# Patient Record
Sex: Female | Born: 1955 | Race: White | Hispanic: No | Smoking: Current every day smoker
Health system: Southern US, Community
[De-identification: ages and names within clinical notes are randomized; demographics above are authoritative.]

## PROBLEM LIST (undated history)

## (undated) DIAGNOSIS — I10 Essential (primary) hypertension: Secondary | ICD-10-CM

## (undated) DIAGNOSIS — G8929 Other chronic pain: Secondary | ICD-10-CM

## (undated) DIAGNOSIS — R112 Nausea with vomiting, unspecified: Secondary | ICD-10-CM

## (undated) DIAGNOSIS — M797 Fibromyalgia: Secondary | ICD-10-CM

## (undated) DIAGNOSIS — S32810B Multiple fractures of pelvis with stable disruption of pelvic ring, initial encounter for open fracture: Secondary | ICD-10-CM

## (undated) DIAGNOSIS — I499 Cardiac arrhythmia, unspecified: Secondary | ICD-10-CM

## (undated) DIAGNOSIS — M1990 Unspecified osteoarthritis, unspecified site: Secondary | ICD-10-CM

## (undated) DIAGNOSIS — S42102A Fracture of unspecified part of scapula, left shoulder, initial encounter for closed fracture: Secondary | ICD-10-CM

## (undated) DIAGNOSIS — Z9889 Other specified postprocedural states: Secondary | ICD-10-CM

## (undated) DIAGNOSIS — M549 Dorsalgia, unspecified: Secondary | ICD-10-CM

## (undated) DIAGNOSIS — Z8711 Personal history of peptic ulcer disease: Secondary | ICD-10-CM

## (undated) DIAGNOSIS — T884XXA Failed or difficult intubation, initial encounter: Secondary | ICD-10-CM

## (undated) DIAGNOSIS — K219 Gastro-esophageal reflux disease without esophagitis: Secondary | ICD-10-CM

## (undated) DIAGNOSIS — Z8719 Personal history of other diseases of the digestive system: Secondary | ICD-10-CM

## (undated) HISTORY — PX: CARPAL TUNNEL RELEASE: SHX101

## (undated) HISTORY — PX: BACK SURGERY: SHX140

## (undated) HISTORY — PX: PLANTAR'S WART EXCISION: SHX2240

## (undated) HISTORY — PX: INCONTINENCE SURGERY: SHX676

## (undated) HISTORY — PX: COLONOSCOPY: SHX174

## (undated) HISTORY — PX: KNEE ARTHROSCOPY: SHX127

---
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
## 1982-08-30 HISTORY — PX: ABDOMINAL HYSTERECTOMY: SHX81

## 2003-08-31 HISTORY — PX: LAPAROSCOPIC CHOLECYSTECTOMY: SUR755

## 2005-04-08 ENCOUNTER — Encounter: Admission: RE | Admit: 2005-04-08 | Discharge: 2005-04-08 | Payer: Self-pay | Admitting: Family Medicine

## 2005-04-19 ENCOUNTER — Encounter: Admission: RE | Admit: 2005-04-19 | Discharge: 2005-04-19 | Payer: Self-pay | Admitting: Family Medicine

## 2010-09-20 ENCOUNTER — Encounter: Payer: Self-pay | Admitting: Family Medicine

## 2011-09-16 ENCOUNTER — Encounter (HOSPITAL_COMMUNITY): Payer: Self-pay | Admitting: Pharmacy Technician

## 2011-09-21 ENCOUNTER — Encounter (HOSPITAL_COMMUNITY)
Admission: RE | Admit: 2011-09-21 | Discharge: 2011-09-21 | Disposition: A | Discharge status: Home / Self Care | Payer: Managed Care, Other (non HMO) | Source: Ambulatory Visit | Attending: Neurosurgery | Admitting: Neurosurgery

## 2011-09-21 ENCOUNTER — Encounter (HOSPITAL_COMMUNITY): Payer: Self-pay

## 2011-09-21 ENCOUNTER — Other Ambulatory Visit: Payer: Self-pay

## 2011-09-21 ENCOUNTER — Encounter (HOSPITAL_COMMUNITY)
Admission: RE | Admit: 2011-09-21 | Discharge: 2011-09-21 | Disposition: A | Discharge status: Home / Self Care | Payer: Managed Care, Other (non HMO) | Source: Ambulatory Visit | Attending: Anesthesiology | Admitting: Anesthesiology

## 2011-09-21 HISTORY — DX: Other specified postprocedural states: Z98.890

## 2011-09-21 HISTORY — DX: Other specified postprocedural states: R11.2

## 2011-09-21 HISTORY — DX: Unspecified osteoarthritis, unspecified site: M19.90

## 2011-09-21 HISTORY — DX: Fibromyalgia: M79.7

## 2011-09-21 HISTORY — DX: Essential (primary) hypertension: I10

## 2011-09-21 HISTORY — DX: Gastro-esophageal reflux disease without esophagitis: K21.9

## 2011-09-21 LAB — BASIC METABOLIC PANEL
CO2: 23 mEq/L (ref 19–32)
Calcium: 9.6 mg/dL (ref 8.4–10.5)
Chloride: 103 mEq/L (ref 96–112)
Creatinine, Ser: 0.65 mg/dL (ref 0.50–1.10)
Glucose, Bld: 101 mg/dL — ABNORMAL HIGH (ref 70–99)
Sodium: 138 mEq/L (ref 135–145)

## 2011-09-21 LAB — CBC
HCT: 41.7 % (ref 36.0–46.0)
MCH: 30.1 pg (ref 26.0–34.0)
MCV: 87.8 fL (ref 78.0–100.0)
RBC: 4.75 MIL/uL (ref 3.87–5.11)

## 2011-09-21 LAB — SURGICAL PCR SCREEN: MRSA, PCR: NEGATIVE

## 2011-09-21 LAB — TYPE AND SCREEN: ABO/RH(D): O POS

## 2011-09-21 NOTE — Pre-Procedure Instructions (Addendum)
20 SAHASRA BELUE  09/21/2011   Your procedure is scheduled on:  1.30.13  Report to [facility] aT 630 AM.  Call this number if you have problems the morning of surgery: [phone]   Remember:   Do not eat food:After Midnight.  May have clear liquids: up to 4 Hours before arrival.  Clear liquids include soda, tea, black coffee, apple or grape juice, broth.  Take these medicines the morning of surgery with A SIP OF WATER :cymbalta, nexium, robaxin  STOP asa, herbal meds, blood thinners, nsaids   Do not wear jewelry, make-up or nail polish.  Do not wear lotions, powders, or perfumes. You may wear deodorant.  Do not shave 48 hours prior to surgery.  Do not bring valuables to the hospital.  Contacts, dentures or bridgework may not be worn into surgery.  Leave suitcase in the car. After surgery it may be brought to your room.  For patients admitted to the hospital, checkout time is 11:00 AM the day of discharge.   Patients discharged the day of surgery will not be allowed to drive home.  Name and phone number of your driver: Fayrene Fearing [phone]  Special Instructions: CHG Shower Use Special Wash: 1/2 bottle night before surgery and 1/2 bottle morning of surgery.   Please read over the following fact sheets that you were given: Pain Booklet, Coughing and Deep Breathing, MRSA Information and Surgical Site Infection Prevention

## 2011-09-29 ENCOUNTER — Encounter (HOSPITAL_COMMUNITY)
Admission: RE | Disposition: A | Discharge status: Home / Self Care | Payer: Self-pay | Source: Ambulatory Visit | Attending: Neurosurgery

## 2011-09-29 ENCOUNTER — Ambulatory Visit (HOSPITAL_COMMUNITY): Payer: Managed Care, Other (non HMO)

## 2011-09-29 ENCOUNTER — Ambulatory Visit (HOSPITAL_COMMUNITY): Payer: Managed Care, Other (non HMO) | Admitting: Certified Registered"

## 2011-09-29 ENCOUNTER — Encounter (HOSPITAL_COMMUNITY): Payer: Self-pay | Admitting: Certified Registered"

## 2011-09-29 ENCOUNTER — Inpatient Hospital Stay (HOSPITAL_COMMUNITY)
Admission: RE | Admit: 2011-09-29 | Discharge: 2011-10-03 | DRG: 457 | Disposition: A | Discharge status: Home / Self Care | Payer: Managed Care, Other (non HMO) | Source: Ambulatory Visit | Attending: Neurosurgery | Admitting: Neurosurgery

## 2011-09-29 ENCOUNTER — Encounter (HOSPITAL_COMMUNITY): Payer: Self-pay | Admitting: *Deleted

## 2011-09-29 DIAGNOSIS — Z01812 Encounter for preprocedural laboratory examination: Secondary | ICD-10-CM

## 2011-09-29 DIAGNOSIS — K219 Gastro-esophageal reflux disease without esophagitis: Secondary | ICD-10-CM | POA: Diagnosis present

## 2011-09-29 DIAGNOSIS — M4310 Spondylolisthesis, site unspecified: Secondary | ICD-10-CM | POA: Diagnosis present

## 2011-09-29 DIAGNOSIS — M4120 Other idiopathic scoliosis, site unspecified: Principal | ICD-10-CM | POA: Diagnosis present

## 2011-09-29 DIAGNOSIS — B370 Candidal stomatitis: Secondary | ICD-10-CM | POA: Diagnosis present

## 2011-09-29 DIAGNOSIS — IMO0001 Reserved for inherently not codable concepts without codable children: Secondary | ICD-10-CM | POA: Diagnosis present

## 2011-09-29 DIAGNOSIS — I10 Essential (primary) hypertension: Secondary | ICD-10-CM | POA: Diagnosis present

## 2011-09-29 DIAGNOSIS — M47816 Spondylosis without myelopathy or radiculopathy, lumbar region: Secondary | ICD-10-CM

## 2011-09-29 HISTORY — PX: ANTERIOR LAT LUMBAR FUSION: SHX1168

## 2011-09-29 SURGERY — ANTERIOR LATERAL LUMBAR FUSION 2 LEVELS
Anesthesia: General | Site: Back | Wound class: Clean

## 2011-09-29 MED ORDER — ACETAMINOPHEN 325 MG PO TABS: 650.0000 mg | ORAL_TABLET | ORAL | Status: DC | PRN

## 2011-09-29 MED ORDER — ONDANSETRON HCL 4 MG/2ML IJ SOLN
INTRAMUSCULAR | Status: DC | PRN
  Administered 2011-09-29: 4 mg via INTRAVENOUS

## 2011-09-29 MED ORDER — GLYCOPYRROLATE 0.2 MG/ML IJ SOLN
INTRAMUSCULAR | Status: DC | PRN
  Administered 2011-09-29: .7 mg via INTRAVENOUS

## 2011-09-29 MED ORDER — HYDROMORPHONE HCL PF 1 MG/ML IJ SOLN
0.5000 mg | INTRAMUSCULAR | Status: DC | PRN
  Administered 2011-09-29 – 2011-10-02 (×18): 1 mg via INTRAVENOUS
  Administered 2011-10-03: 0.5 mg via INTRAVENOUS
  Filled 2011-09-29 (×18): qty 1

## 2011-09-29 MED ORDER — KCL IN DEXTROSE-NACL 20-5-0.45 MEQ/L-%-% IV SOLN
INTRAVENOUS | Status: AC
  Filled 2011-09-29: qty 1000

## 2011-09-29 MED ORDER — LIDOCAINE-EPINEPHRINE 1 %-1:100000 IJ SOLN
INTRAMUSCULAR | Status: DC | PRN
  Administered 2011-09-29: 20 mL

## 2011-09-29 MED ORDER — PHENYLEPHRINE HCL 10 MG/ML IJ SOLN
20.0000 mg | INTRAVENOUS | Status: DC | PRN
  Administered 2011-09-29: 50 ug/min via INTRAVENOUS

## 2011-09-29 MED ORDER — THROMBIN 20000 UNITS EX KIT
PACK | CUTANEOUS | Status: DC | PRN
  Administered 2011-09-29: 15:00:00 via TOPICAL

## 2011-09-29 MED ORDER — MIDAZOLAM HCL 5 MG/5ML IJ SOLN
INTRAMUSCULAR | Status: DC | PRN
  Administered 2011-09-29: 2 mg via INTRAVENOUS

## 2011-09-29 MED ORDER — ACETAMINOPHEN 10 MG/ML IV SOLN
INTRAVENOUS | Status: AC
  Administered 2011-09-29 (×2): 1000 mg via INTRAVENOUS
  Filled 2011-09-29: qty 100

## 2011-09-29 MED ORDER — PHENYLEPHRINE HCL 10 MG/ML IJ SOLN
INTRAMUSCULAR | Status: DC | PRN
  Administered 2011-09-29 (×4): 80 ug via INTRAVENOUS

## 2011-09-29 MED ORDER — MIDAZOLAM HCL 2 MG/2ML IJ SOLN
1.0000 mg | INTRAMUSCULAR | Status: DC | PRN
  Administered 2011-09-29: 1 mg via INTRAVENOUS

## 2011-09-29 MED ORDER — ZOLPIDEM TARTRATE 10 MG PO TABS
10.0000 mg | ORAL_TABLET | Freq: Every day | ORAL | Status: DC
  Administered 2011-10-01 – 2011-10-02 (×2): 10 mg via ORAL
  Filled 2011-09-29 (×2): qty 1

## 2011-09-29 MED ORDER — SUCCINYLCHOLINE CHLORIDE 20 MG/ML IJ SOLN
INTRAMUSCULAR | Status: DC | PRN
  Administered 2011-09-29: 100 mg via INTRAVENOUS

## 2011-09-29 MED ORDER — 0.9 % SODIUM CHLORIDE (POUR BTL) OPTIME
TOPICAL | Status: DC | PRN
  Administered 2011-09-29 (×2): 1000 mL

## 2011-09-29 MED ORDER — SODIUM CHLORIDE 0.9 % IV SOLN
INTRAVENOUS | Status: AC
  Filled 2011-09-29: qty 500

## 2011-09-29 MED ORDER — HYDROMORPHONE HCL PF 1 MG/ML IJ SOLN
0.2500 mg | INTRAMUSCULAR | Status: DC | PRN
  Administered 2011-09-29 (×5): 0.5 mg via INTRAVENOUS

## 2011-09-29 MED ORDER — LACTATED RINGERS IV SOLN
INTRAVENOUS | Status: DC | PRN
  Administered 2011-09-29 (×4): via INTRAVENOUS

## 2011-09-29 MED ORDER — ONDANSETRON HCL 4 MG/2ML IJ SOLN
4.0000 mg | INTRAMUSCULAR | Status: DC | PRN
  Administered 2011-09-29 – 2011-09-30 (×4): 4 mg via INTRAVENOUS
  Filled 2011-09-29 (×4): qty 2

## 2011-09-29 MED ORDER — MENTHOL 3 MG MT LOZG
1.0000 | LOZENGE | OROMUCOSAL | Status: DC | PRN
  Filled 2011-09-29: qty 9

## 2011-09-29 MED ORDER — SODIUM CHLORIDE 0.9 % IR SOLN
Status: DC | PRN
  Administered 2011-09-29 (×3)

## 2011-09-29 MED ORDER — FENTANYL CITRATE 0.05 MG/ML IJ SOLN
INTRAMUSCULAR | Status: DC | PRN
  Administered 2011-09-29 (×2): 50 ug via INTRAVENOUS
  Administered 2011-09-29 (×2): 100 ug via INTRAVENOUS
  Administered 2011-09-29 (×2): 50 ug via INTRAVENOUS
  Administered 2011-09-29: 150 ug via INTRAVENOUS
  Administered 2011-09-29: 50 ug via INTRAVENOUS

## 2011-09-29 MED ORDER — METHOCARBAMOL 500 MG PO TABS
750.0000 mg | ORAL_TABLET | Freq: Every evening | ORAL | Status: DC | PRN
  Filled 2011-09-29 (×2): qty 2

## 2011-09-29 MED ORDER — CEFAZOLIN SODIUM 1-5 GM-% IV SOLN
1.0000 g | Freq: Once | INTRAVENOUS | Status: AC
  Administered 2011-09-29 (×2): 1 g via INTRAVENOUS

## 2011-09-29 MED ORDER — ACETAMINOPHEN 650 MG RE SUPP: 650.0000 mg | RECTAL | Status: DC | PRN

## 2011-09-29 MED ORDER — HYDROMORPHONE HCL PF 1 MG/ML IJ SOLN
INTRAMUSCULAR | Status: AC
  Filled 2011-09-29: qty 1

## 2011-09-29 MED ORDER — LIDOCAINE HCL (CARDIAC) 20 MG/ML IV SOLN
INTRAVENOUS | Status: DC | PRN
  Administered 2011-09-29: 25 mg via INTRAVENOUS

## 2011-09-29 MED ORDER — ROCURONIUM BROMIDE 100 MG/10ML IV SOLN
INTRAVENOUS | Status: DC | PRN
  Administered 2011-09-29: 20 mg via INTRAVENOUS
  Administered 2011-09-29 (×2): 10 mg via INTRAVENOUS
  Administered 2011-09-29: 20 mg via INTRAVENOUS
  Administered 2011-09-29: 50 mg via INTRAVENOUS
  Administered 2011-09-29: 10 mg via INTRAVENOUS

## 2011-09-29 MED ORDER — PROMETHAZINE HCL 25 MG/ML IJ SOLN
6.2500 mg | INTRAMUSCULAR | Status: DC | PRN
  Administered 2011-09-29: 6.25 mg via INTRAVENOUS
  Filled 2011-09-29: qty 1

## 2011-09-29 MED ORDER — BACITRACIN 50000 UNITS IM SOLR
INTRAMUSCULAR | Status: AC
  Filled 2011-09-29: qty 1

## 2011-09-29 MED ORDER — DULOXETINE HCL 60 MG PO CPEP
60.0000 mg | ORAL_CAPSULE | Freq: Two times a day (BID) | ORAL | Status: DC
  Administered 2011-09-30 – 2011-10-03 (×7): 60 mg via ORAL
  Filled 2011-09-29 (×9): qty 1

## 2011-09-29 MED ORDER — SODIUM CHLORIDE 0.9 % IJ SOLN
3.0000 mL | Freq: Two times a day (BID) | INTRAMUSCULAR | Status: DC
  Administered 2011-09-30 – 2011-10-02 (×4): 3 mL via INTRAVENOUS

## 2011-09-29 MED ORDER — ONDANSETRON HCL 4 MG/2ML IJ SOLN
4.0000 mg | Freq: Four times a day (QID) | INTRAMUSCULAR | Status: AC | PRN
  Administered 2011-09-29: 4 mg via INTRAVENOUS

## 2011-09-29 MED ORDER — CEFAZOLIN SODIUM 1-5 GM-% IV SOLN
INTRAVENOUS | Status: AC
  Filled 2011-09-29: qty 50

## 2011-09-29 MED ORDER — DEXAMETHASONE SODIUM PHOSPHATE 4 MG/ML IJ SOLN
INTRAMUSCULAR | Status: DC | PRN
  Administered 2011-09-29: 8 mg via INTRAVENOUS

## 2011-09-29 MED ORDER — OXYCODONE-ACETAMINOPHEN 10-325 MG PO TABS: 1.0000 | ORAL_TABLET | ORAL | Status: DC | PRN

## 2011-09-29 MED ORDER — ACETAMINOPHEN 10 MG/ML IV SOLN
INTRAVENOUS | Status: AC
  Filled 2011-09-29: qty 100

## 2011-09-29 MED ORDER — HETASTARCH-ELECTROLYTES 6 % IV SOLN
INTRAVENOUS | Status: DC | PRN
  Administered 2011-09-29 (×2): via INTRAVENOUS

## 2011-09-29 MED ORDER — HYDROMORPHONE HCL PF 1 MG/ML IJ SOLN
0.5000 mg | INTRAMUSCULAR | Status: DC | PRN
  Filled 2011-09-29: qty 1

## 2011-09-29 MED ORDER — OXYCODONE-ACETAMINOPHEN 5-325 MG PO TABS
2.0000 | ORAL_TABLET | ORAL | Status: DC | PRN
  Administered 2011-09-30 – 2011-10-03 (×8): 2 via ORAL
  Filled 2011-09-29 (×8): qty 2

## 2011-09-29 MED ORDER — NEOSTIGMINE METHYLSULFATE 1 MG/ML IJ SOLN
INTRAMUSCULAR | Status: DC | PRN
  Administered 2011-09-29: 5 mg via INTRAVENOUS

## 2011-09-29 MED ORDER — ACETAMINOPHEN 10 MG/ML IV SOLN: 1000.0000 mg | Freq: Once | INTRAVENOUS | Status: AC

## 2011-09-29 MED ORDER — BUPIVACAINE HCL (PF) 0.5 % IJ SOLN
INTRAMUSCULAR | Status: DC | PRN
  Administered 2011-09-29: 20 mL

## 2011-09-29 MED ORDER — ONDANSETRON HCL 4 MG/2ML IJ SOLN
INTRAMUSCULAR | Status: AC
  Filled 2011-09-29: qty 2

## 2011-09-29 MED ORDER — CEFAZOLIN SODIUM 1-5 GM-% IV SOLN
1.0000 g | Freq: Three times a day (TID) | INTRAVENOUS | Status: AC
  Administered 2011-09-29 – 2011-09-30 (×2): 1 g via INTRAVENOUS
  Filled 2011-09-29 (×2): qty 50

## 2011-09-29 MED ORDER — HEMOSTATIC AGENTS (NO CHARGE) OPTIME: TOPICAL | Status: DC | PRN

## 2011-09-29 MED ORDER — PROMETHAZINE HCL 25 MG/ML IJ SOLN
INTRAMUSCULAR | Status: AC
  Filled 2011-09-29: qty 1

## 2011-09-29 MED ORDER — PROPOFOL 10 MG/ML IV EMUL
INTRAVENOUS | Status: DC | PRN
  Administered 2011-09-29: 170 mg via INTRAVENOUS
  Administered 2011-09-29 (×2): 20 mg via INTRAVENOUS
  Administered 2011-09-29: 60 mg via INTRAVENOUS

## 2011-09-29 MED ORDER — PHENOL 1.4 % MT LIQD: 1.0000 | OROMUCOSAL | Status: DC | PRN

## 2011-09-29 MED ORDER — PANTOPRAZOLE SODIUM 40 MG IV SOLR: 40.0000 mg | Freq: Every day | INTRAVENOUS | Status: DC

## 2011-09-29 MED ORDER — THROMBIN 20000 UNITS EX KIT
PACK | CUTANEOUS | Status: DC | PRN
  Administered 2011-09-29: 10:00:00 via TOPICAL

## 2011-09-29 MED ORDER — MIDAZOLAM HCL 2 MG/2ML IJ SOLN
INTRAMUSCULAR | Status: AC
  Filled 2011-09-29: qty 2

## 2011-09-29 MED ORDER — KCL IN DEXTROSE-NACL 20-5-0.45 MEQ/L-%-% IV SOLN
INTRAVENOUS | Status: DC
  Administered 2011-09-29: 18:00:00 via INTRAVENOUS
  Administered 2011-09-30: 75 mL/h via INTRAVENOUS
  Administered 2011-10-03: 05:00:00 via INTRAVENOUS
  Filled 2011-09-29 (×9): qty 1000

## 2011-09-29 MED ORDER — PANTOPRAZOLE SODIUM 40 MG PO TBEC
40.0000 mg | DELAYED_RELEASE_TABLET | Freq: Every day | ORAL | Status: DC
  Administered 2011-09-30 – 2011-10-03 (×5): 40 mg via ORAL
  Filled 2011-09-29 (×5): qty 1

## 2011-09-29 MED ORDER — OLMESARTAN MEDOXOMIL 40 MG PO TABS
40.0000 mg | ORAL_TABLET | Freq: Every day | ORAL | Status: DC
  Administered 2011-10-01 – 2011-10-02 (×2): 40 mg via ORAL
  Filled 2011-09-29 (×5): qty 1

## 2011-09-29 SURGICAL SUPPLY — 100 items
ADH SKN CLS APL DERMABOND .7 (GAUZE/BANDAGES/DRESSINGS) ×1
ADH SKN CLS LQ APL DERMABOND (GAUZE/BANDAGES/DRESSINGS) ×1
APL SKNCLS STERI-STRIP NONHPOA (GAUZE/BANDAGES/DRESSINGS) ×1
BAG DECANTER FOR FLEXI CONT (MISCELLANEOUS) ×5 IMPLANT
BENZOIN TINCTURE PRP APPL 2/3 (GAUZE/BANDAGES/DRESSINGS) ×3 IMPLANT
BLADE SURG 11 STRL SS (BLADE) ×3 IMPLANT
BLADE SURG ROTATE 9660 (MISCELLANEOUS) IMPLANT
BONE MATRIX OSTEOCEL PLUS 10CC (Bone Implant) ×1 IMPLANT
BONE MATRIX OSTEOCEL PLUS 5CC (Bone Implant) ×1 IMPLANT
BRUSH SCRUB EZ PLAIN DRY (MISCELLANEOUS) ×3 IMPLANT
BUR MATCHSTICK NEURO 3.0 LAGG (BURR) ×2 IMPLANT
BUR PRECISION FLUTE 6.0 (BURR) ×2 IMPLANT
CANISTER SUCTION 2500CC (MISCELLANEOUS) ×2 IMPLANT
CLOSURE STERI STRIP 1/2 X4 (GAUZE/BANDAGES/DRESSINGS) ×1 IMPLANT
CLOTH BEACON ORANGE TIMEOUT ST (SAFETY) ×3 IMPLANT
CONNECTOR ADJ CC 50-60MM (Screw) ×1 IMPLANT
CONT SPEC 4OZ CLIKSEAL STRL BL (MISCELLANEOUS) ×4 IMPLANT
COROENT LO 10X10X25MM (Cage) ×2 IMPLANT
COVER BACK TABLE 24X17X13 BIG (DRAPES) IMPLANT
COVER TABLE BACK 60X90 (DRAPES) ×2 IMPLANT
DECANTER SPIKE VIAL GLASS SM (MISCELLANEOUS) ×2 IMPLANT
DERMABOND ADHESIVE PROPEN (GAUZE/BANDAGES/DRESSINGS) ×1
DERMABOND ADVANCED (GAUZE/BANDAGES/DRESSINGS) ×1
DERMABOND ADVANCED .7 DNX12 (GAUZE/BANDAGES/DRESSINGS) ×3 IMPLANT
DERMABOND ADVANCED .7 DNX6 (GAUZE/BANDAGES/DRESSINGS) IMPLANT
DRAPE C-ARM 42X72 X-RAY (DRAPES) ×8 IMPLANT
DRAPE C-ARMOR (DRAPES) ×3 IMPLANT
DRAPE LAPAROTOMY 100X72X124 (DRAPES) ×4 IMPLANT
DRAPE POUCH INSTRU U-SHP 10X18 (DRAPES) ×4 IMPLANT
DRAPE PROXIMA HALF (DRAPES) IMPLANT
DRAPE SURG 17X23 STRL (DRAPES) ×6 IMPLANT
DRESSING TELFA 8X3 (GAUZE/BANDAGES/DRESSINGS) ×3 IMPLANT
DRSG OPSITE 4X5.5 SM (GAUZE/BANDAGES/DRESSINGS) ×5 IMPLANT
ELECT BLADE 4.0 EZ CLEAN MEGAD (MISCELLANEOUS) ×2
ELECT REM PT RETURN 9FT ADLT (ELECTROSURGICAL) ×4
ELECTRODE BLDE 4.0 EZ CLN MEGD (MISCELLANEOUS) IMPLANT
ELECTRODE REM PT RTRN 9FT ADLT (ELECTROSURGICAL) ×2 IMPLANT
EVACUATOR 3/16  PVC DRAIN (DRAIN) ×1
EVACUATOR 3/16 PVC DRAIN (DRAIN) ×1 IMPLANT
GAUZE SPONGE 4X4 16PLY XRAY LF (GAUZE/BANDAGES/DRESSINGS) ×2 IMPLANT
GLOVE BIO SURGEON STRL SZ8 (GLOVE) ×6 IMPLANT
GLOVE ECLIPSE 7.5 STRL STRAW (GLOVE) ×2 IMPLANT
GLOVE EXAM NITRILE LRG STRL (GLOVE) IMPLANT
GLOVE EXAM NITRILE MD LF STRL (GLOVE) ×2 IMPLANT
GLOVE EXAM NITRILE XL STR (GLOVE) IMPLANT
GLOVE EXAM NITRILE XS STR PU (GLOVE) IMPLANT
GLOVE INDICATOR 7.0 STRL GRN (GLOVE) ×4 IMPLANT
GLOVE INDICATOR 7.5 STRL GRN (GLOVE) ×1 IMPLANT
GLOVE INDICATOR 8.5 STRL (GLOVE) ×6 IMPLANT
GLOVE SS BIOGEL STRL SZ 6.5 (GLOVE) IMPLANT
GLOVE SUPERSENSE BIOGEL SZ 6.5 (GLOVE) ×5
GOWN BRE IMP SLV AUR LG STRL (GOWN DISPOSABLE) IMPLANT
GOWN BRE IMP SLV AUR XL STRL (GOWN DISPOSABLE) ×11 IMPLANT
GOWN STRL REIN 2XL LVL4 (GOWN DISPOSABLE) IMPLANT
GRAFT BN 10X1XDBM MAGNIFUSE (Bone Implant) IMPLANT
GRAFT BONE MAGNIFUSE 1X10CM (Bone Implant) ×4 IMPLANT
IMPLANT COROENT LDTXL 10X18X50 (Intraocular Lens) ×1 IMPLANT
IMPLANT COROENT XL 10X18X45 (Intraocular Lens) ×1 IMPLANT
KIT BASIN OR (CUSTOM PROCEDURE TRAY) ×4 IMPLANT
KIT DILATOR XLIF 5 (KITS) IMPLANT
KIT MAXCESS (KITS) ×1 IMPLANT
KIT NDL NVM5 EMG ELECT (KITS) IMPLANT
KIT NEEDLE NVM5 EMG ELECT (KITS) ×1 IMPLANT
KIT NEEDLE NVM5 EMG ELECTRODE (KITS) ×1
KIT ROOM TURNOVER OR (KITS) ×4 IMPLANT
KIT XLIF (KITS) ×1
MARKER SKIN DUAL TIP RULER LAB (MISCELLANEOUS) ×1 IMPLANT
MASTERGRAFT MATX STRIP 24CC (Orthopedic Implant) ×2 IMPLANT
MATRIX MASTERGRAFT STRIP 24CC (Orthopedic Implant) IMPLANT
NDL HYPO 25X1 1.5 SAFETY (NEEDLE) ×2 IMPLANT
NEEDLE HYPO 25X1 1.5 SAFETY (NEEDLE) ×4 IMPLANT
NS IRRIG 1000ML POUR BTL (IV SOLUTION) ×4 IMPLANT
PACK LAMINECTOMY NEURO (CUSTOM PROCEDURE TRAY) ×4 IMPLANT
PAD ARMBOARD 7.5X6 YLW CONV (MISCELLANEOUS) ×6 IMPLANT
PUTTY 10ML ACTIFUSE ABX (Putty) ×1 IMPLANT
ROD ARMADA (Rod) ×1 IMPLANT
SCREW 6.5X40MM (Screw) ×8 IMPLANT
SCREW BN 40X6.5XPA NS SPNE (Screw) IMPLANT
SCREW LOCK (Screw) ×24 IMPLANT
SCREW LOCK 100X5.5X OPN (Screw) IMPLANT
SCREW POLY 45X6.5 (Screw) IMPLANT
SCREW POLY 5.5X40MM (Screw) ×2 IMPLANT
SCREW POLY 5.5X45MM (Screw) ×2 IMPLANT
SCREW POLY 6.5X45MM (Screw) ×8 IMPLANT
SPONGE GAUZE 4X4 12PLY (GAUZE/BANDAGES/DRESSINGS) ×2 IMPLANT
SPONGE LAP 4X18 X RAY DECT (DISPOSABLE) IMPLANT
SPONGE SURGIFOAM ABS GEL 100 (HEMOSTASIS) ×2 IMPLANT
STRIP CLOSURE SKIN 1/2X4 (GAUZE/BANDAGES/DRESSINGS) ×7 IMPLANT
SUT VIC AB 0 CT1 18XCR BRD8 (SUTURE) ×2 IMPLANT
SUT VIC AB 0 CT1 8-18 (SUTURE) ×4
SUT VIC AB 2-0 CT1 18 (SUTURE) ×4 IMPLANT
SUT VICRYL 4-0 PS2 18IN ABS (SUTURE) ×8 IMPLANT
SWABSTICK BENZOIN STERILE (MISCELLANEOUS) ×2 IMPLANT
SYR 20CC LL (SYRINGE) ×1 IMPLANT
SYR 20ML ECCENTRIC (SYRINGE) ×4 IMPLANT
TAPE CLOTH 3X10 TAN LF (GAUZE/BANDAGES/DRESSINGS) ×6 IMPLANT
TOWEL OR 17X24 6PK STRL BLUE (TOWEL DISPOSABLE) ×4 IMPLANT
TOWEL OR 17X26 10 PK STRL BLUE (TOWEL DISPOSABLE) ×4 IMPLANT
TRAY FOLEY CATH 14FRSI W/METER (CATHETERS) ×3 IMPLANT
WATER STERILE IRR 1000ML POUR (IV SOLUTION) ×4 IMPLANT

## 2011-09-29 NOTE — Op Note (Signed)
Preoperative diagnosis: Degenerative scoliosis L1-S1 and grade 1 spondylolisthesis L4-5 with severe bilateral L5 radiculopathies  Postoperative diagnosis: Same  Procedure: #1 anterolateral interbody fusion at L2-3 and L3-4 using the Nuvasive XLIF and coroent XL 10 x 18 x 40 510 lordotic peak cage at L2-3 packed with active use mixed with osteocel, and  #2 anterolateral interbody fusion at L3-4 using the Nuvasive XLIF and corns 10 x 18 x 50 mm 10 lordotic peak cage packed with actifuse and osteocel  #3 through a separate skin incision pedicle screw fixation L1-S1 using min invasive Armida 5.5 pedicle screw system  #4 posterior lateral arthrodesis L1-S1 using 9 views master graft osseous L. mixed with autologous bone graft  #3TLIF at L4-5 and L5-S1 from the left using coroent 10 x 10 x 25 mm lordotic cage at L4-5 and a corns 10 x 10 x 25 mm 5 lordotic cage L5-S1  #4 open reduction spinal deformity  #5 placement of a large Hemovac drain  Surgeon: Jillyn Hidden Angelis Gates  Assistant: Shirlean Kelly  Anesthesia: Gen.  EBL: Less than 400  History of present illness: Patient is a very pleasant 56 year old female has had long-standing chronic back pain with bilateral leg pain that's been refractory to all forms of conservative treatment imaging findings showed severe degenerative scoliosis convex to the left with severe foraminal collapse at all level and degenerative disc disease at L2-3 [id number]. Patient been refractory to all forms of conservative treatment with epidural epidural steroid injections facet blocks anti-inflammatories and chronic pain management so she was recommended deformity correction of her degenerative scoliosis and combined direct and indirect decompression with interbody implants. Risks and benefits of the operation alternatives to surgery perioperative course the case of outcome were all spine the patient she understands and agrees to proceed forward.  Operative procedure: Patient was  brought into the OR was induced under general anesthesia with no muscle relaxation. She was positioned in the left t left side up and using fluoroscopy she was taped to the bed and the bed was broken flexing and opening upper disc spaces from the left prostheses used the step along the way help confirm proper alignment and patient was taped in position. Then using localization with fluoroscopy 3 incisions were drawn out and alignment 34 disc space the other with 23 disc space and a right peroneal incision. After adequate prepping and draping of the L3 for incision and the right peroneal says were incised. As the finger to the right peroneal incision palpated both TPs on the finger up felt iliac crest as well as the 12th rib and brought up into the L3 for incision passing a dilator over-the-top my finger down into the psoas of the L3-4 disc space. Neuro monitoring confirm this to be a safe sounds of this was sequentially dilated up and a K wire was inserted and the disc space. Fluoroscopy U. C7 the way confirmed good entry point at approximately 30 yd line of 34 disc space. Again monitoring used a stable the way during dilation and placement of the retractor with 110 mm Blake confirmed this to be a safe zone in the disc space. Then the fluoroscopy was used to confirm adequate positioning of the retractor. The space was incised cleanout pituitary rongeurs. Using Cobbs the endplates were scraped in the contralateral annulus was released. Then using pushes and pulls as well as rasps the endplates were prepared the size 16 paddle help release the contralateral annulus a well as well and this allowed significant reduction of  the deformity at L3-4. So then a size 10 lordotic trial when this was felt to be a proper size for the graft. So a 10 x 18 mm x 50 mm cage was packed with active use and osseous L. and this was inserted over slides and a 34 disc space. Again fluoroscopy used to some away as well as well as continuous  neuro monitoring confirm this to be safe adequate and in good position. After adequate placement graft achieved attention second to 3 disc space this and this incision was incised in a similar fashion her dilator was passed over my finger palpating to 3 DP and spleen down over the psoas and again the Ross be confirmed good position K wire was placed and the dilators is grossly placed with continuous neuro monitoring. In a similar fashion this disc space was prepared and except a 10 x 18 x 45 mm cage packed with active use and osseous L. which again had good reduction of the deformity L2-3. Changes are monitoring fluoroscopy again confirmed good trajectory in good position of the implant. After all this been achieved the wounds copiously ago posterior fluoroscopy confirmed good position to the implants and the wounds were closed in layers with after Vicryl and a running 4 subcuticular in the skin. Patient was then repositioned for the posterior aspect of the case. After adequate repositioned prone the Wilson frame her back was prepped and draped in routine sterile fashion after infiltration of 20 cc lidocaine with epi a midline incision was made and Bovie light cautery was used taken subcutaneous tissue subperiosteal dissection was carried out the lamina from L1 down to S1 exposing the TPs from L1 down to S1 and again fluoroscopy confirmed adequate medication the proper landmarks. So left-sided laminotomies were begun at L4-5 and L5-S1 on the left bleed medial facetectomy performed identifying both the 4-5 and S1 nerve roots and significant unroofing of the 4 was carried out to decompress the root. The fiber was also marked out the foramen and this was under marked stenosis on the left side predominately from the spinal listhesis but also had a marked facet arthropathy is that this level. After the laminotomies and complete facetectomies were performed preserving the spinous process and the facet on the right disc  space were cleaned out with pituitary rongeurs and then prepped for the TLIF. Using a combination of a mass pushes and pulls rotating cutters the endplates were prepared across the midline and aggressively extra foraminally underneath the forward. Working at L4-5-1 trial was placed and 10 x 10 x 25 mm the proper size for the T. left graft to this cage was packed with Oxycel and active use. This was inserted off the midline and then kicked again anteriorly after packing both anteriorly and laterally and bone graft mixed with active use and osteocel. After fluoroscopy confirmed good position of the implant L4-5 to 6 and L5-S1 in a similar fashion L5-S1 disc spaces cleanout from the left in place a prepped in a similar fashion the Ross be used the step along the way and after adequate depression the endplates graft was packed anteriorly and laterally and another 10 x 10 x 25 mm peek cage was inserted. Posterior fluoroscopy confirmed the position of these implants as well as was reduction of the collapse of the left side at L4-5 L5-S1. After only about work been done to check the pedicle screw placement using a high-speed drill pilot holes were drilled using anterior fluoroscopy to landmark the  lateral margin of pedicle at L1-2-3 and 4 then these pedicles were cannulated they were probed using a 45 tap at L1-L2 with 5 5 x 45 5 x 45 screws inserted respectively L1-L2 pedicle screw from within the pedicle as well as external and lumbar laminectomy used this confirm no mediolateral breech fluoroscopy also confirmed good trajectory. Then 6 5 x 45 screws were inserted at L3 02/[birth year]/1939 604 02/02/1941 at L5 and S1 it was a 6 5 x 40 as well the right-sided screws were inserted a similar fashion after all screws in place the rods were contoured and anchored down the S1 screw was torqued down the L5 pedicle screw compressed against S1 the L4 compressing against L5 and then using sequential compression more on the left side to  further reduce the deformity these the rest the remainder the nuts). Aggressive decortication was care MTPs lateral gutters after copious irrigation and combination of bone chips osseous L. master graft and mag views were all packed posterior laterally from L1 down to S1. Large threaded was placed foraminal reinspected to confirm no migration of graft arterial and the wounds copiously again with and closed with interrupted Vicryl and a running 4 subsequent skin. Patient went recovered in stable condition. All needle counts sponge counts were correct per the nurses.

## 2011-09-29 NOTE — OR Nursing (Signed)
Part #1 of procedure ended at 1115. Part #2 patient positioned and prepped at 1132. Start of part #2 [phone]

## 2011-09-29 NOTE — Anesthesia Postprocedure Evaluation (Signed)
Anesthesia Post Note  Patient: Rachel Allison  Procedure(s) Performed:  ANTERIOR LATERAL LUMBAR FUSION 2 LEVELS - Lumbar two- three, three-four,  Anterior Lateral Interbody Fusion; POSTERIOR LUMBAR FUSION 1 LEVEL - Lumbar Four-Five, Lumbar Five-Sacral One Transforaminal Interbody Fusion Posterior Lumbar Fusion, Lumbar one to Sacral One Fusion with pedicle screws  Anesthesia type: General  Patient location: PACU  Post pain: Pain level controlled and Adequate analgesia  Post assessment: Post-op Vital signs reviewed, Patient's Cardiovascular Status Stable, Respiratory Function Stable, Patent Airway and Pain level controlled  Last Vitals:  Filed Vitals:   09/29/11 1645  BP:   Pulse: 70  Temp:   Resp: 35    Post vital signs: Reviewed and stable  Level of consciousness: awake, alert  and oriented  Complications: No apparent anesthesia complications

## 2011-09-29 NOTE — Transfer of Care (Signed)
Immediate Anesthesia Transfer of Care Note  Patient: Rachel Allison  Procedure(s) Performed:  ANTERIOR LATERAL LUMBAR FUSION 2 LEVELS - Lumbar two- three, three-four,  Anterior Lateral Interbody Fusion; POSTERIOR LUMBAR FUSION 1 LEVEL - Lumbar Four-Five, Lumbar Five-Sacral One Transforaminal Interbody Fusion Posterior Lumbar Fusion, Lumbar one to Sacral One Fusion with pedicle screws  Patient Location: PACU  Anesthesia Type: General  Level of Consciousness: awake  Airway & Oxygen Therapy: Patient Spontanous Breathing  Post-op Assessment: Report given to PACU RN  Post vital signs: stable  Complications: No apparent anesthesia complications

## 2011-09-29 NOTE — Addendum Note (Signed)
Addendum  created 09/29/11 1751 by Germaine Pomfret, MD   Modules edited:Orders

## 2011-09-29 NOTE — Anesthesia Preprocedure Evaluation (Addendum)
Anesthesia Evaluation  Patient identified by MRN, date of birth, ID band Patient awake    Reviewed: Allergy & Precautions, H&P , NPO status , Patient's Chart, lab work & pertinent test results  History of Anesthesia Complications (+) PONV  Airway Mallampati: I TM Distance: >3 FB Neck ROM: Full    Dental  (+) Teeth Intact and Dental Advisory Given   Pulmonary Current Smoker,          Cardiovascular hypertension,     Neuro/Psych  Neuromuscular disease    GI/Hepatic GERD-  ,  Endo/Other    Renal/GU      Musculoskeletal  (+) Fibromyalgia -  Abdominal   Peds  Hematology   Anesthesia Other Findings   Reproductive/Obstetrics                          Anesthesia Physical Anesthesia Plan  ASA: II  Anesthesia Plan: General   Post-op Pain Management:    Induction: Intravenous  Airway Management Planned: Oral ETT  Additional Equipment:   Intra-op Plan:   Post-operative Plan: Extubation in OR  Informed Consent: I have reviewed the patients History and Physical, chart, labs and discussed the procedure including the risks, benefits and alternatives for the proposed anesthesia with the patient or authorized representative who has indicated his/her understanding and acceptance.     Plan Discussed with: CRNA and Surgeon  Anesthesia Plan Comments:         Anesthesia Quick Evaluation

## 2011-09-29 NOTE — Addendum Note (Signed)
Addendum  created 09/29/11 1810 by Germaine Pomfret, MD   Modules edited:Orders

## 2011-09-29 NOTE — H&P (Signed)
Rachel Allison is an 56 y.o. female.   Chief Complaint: Back and bilateral leg pain HPI: Patient is a 56 year old female with long-standing chronic low back pain for several years ago down both legs most of the maxilla legs and her feet consistent with an L5 nerve pattern of across the top the foot and big toe. She's been through multiple trials epidural steroid injections anti-inflammatories and management and therapeutic exercises. Shows been through t facet blocks but minimal relief. Imaging has revealed extensive degenerative scoliosis with a degenerative spondylolisthesis at L4-5 causing severe foraminal stenosis the L4 and L5 nerve root and due to her back greater leg pain and degenerative scoliosis and spondylosis into the recommended decompression stabilization procedure excessively gone over the risks and benefits of an L1-S1 fusion with her she understands perioperative course alternatives to surgery and expectations of outcome and agrees to proceed forward.  Past Medical History  Diagnosis Date  . PONV (postoperative nausea and vomiting)   . Hypertension   . GERD (gastroesophageal reflux disease)   . Arthritis   . Fibromyalgia     Past Surgical History  Procedure Date  . Abdominal hysterectomy 27Y RS  . Cholecystectomy 89YRS  . Toe arthroscopy     ROD 9    History reviewed. No pertinent family history. Social History:  reports that she has been smoking.  She does not have any smokeless tobacco history on file. She reports that she does not drink alcohol or use illicit drugs.  Allergies: No Known Allergies  Medications Prior to Admission  Medication Dose Route Frequency Provider Last Rate Last Dose  . ceFAZolin (ANCEF) 1-5 GM-% IVPB           . ceFAZolin (ANCEF) IVPB 1 g/50 mL premix  1 g Intravenous Once Mariam Dollar, MD       No current outpatient prescriptions on file as of 09/29/2011.    No results found for this or any previous visit (from the past 48 hour(s)). No  results found.  Review of Systems  Constitutional: Negative.   HENT: Negative.   Eyes: Negative.   Respiratory: Negative.   Cardiovascular: Negative.   Genitourinary: Negative.   Musculoskeletal: Positive for back pain and joint pain.  Skin: Negative.   Neurological: Positive for tingling.    Blood pressure 115/75, pulse 80, temperature 98.2 F (36.8 C), temperature source Oral, resp. rate 20, SpO2 98.00%. Physical Exam  Constitutional: She is oriented to person, place, and time. She appears well-developed and well-nourished.  HENT:  Head: Normocephalic.  Eyes: Pupils are equal, round, and reactive to light.  Neck: Neck supple.  Cardiovascular: Normal rate.   Respiratory: Breath sounds normal.  GI: Soft.  Neurological: She is alert and oriented to person, place, and time. GCS eye subscore is 4. GCS verbal subscore is 5. GCS motor subscore is 6.  Reflex Scores:      Patellar reflexes are 0 on the right side and 0 on the left side.      Achilles reflexes are 0 on the right side and 0 on the left side.      Strength is 5 out of 5 in her iliopsoas, quads, gastrocs, hamstrings, anterior tibialis, but both EHL is a weak at 4/5. Sensation is grossly intact to light touch     Assessment/Plan 56 year old female presents for L1-S1 fusion with an L2-3 and L3-4 anterolateral interbody fusion and an L4-5 and L5-S1 interbody fusion with L1-S1 pedicle screw fixation. The risks benefits of the  operation alternatives of surgery expectations of outcome  Operative course and she agrees to proceed forward.  Franceen Erisman P 09/29/2011, 8:01 AM

## 2011-09-29 NOTE — Preoperative (Signed)
Beta Blockers   Reason not to administer Beta Blockers:Not Applicable 

## 2011-09-30 ENCOUNTER — Encounter (HOSPITAL_COMMUNITY): Payer: Self-pay | Admitting: Neurosurgery

## 2011-09-30 LAB — CBC
MCH: 29.4 pg (ref 26.0–34.0)
MCV: 89 fL (ref 78.0–100.0)
Platelets: 219 10*3/uL (ref 150–400)
RDW: 13.3 % (ref 11.5–15.5)
WBC: 15 10*3/uL — ABNORMAL HIGH (ref 4.0–10.5)

## 2011-09-30 LAB — DIFFERENTIAL
Eosinophils Absolute: 0 10*3/uL (ref 0.0–0.7)
Eosinophils Relative: 0 % (ref 0–5)
Lymphocytes Relative: 11 % — ABNORMAL LOW (ref 12–46)

## 2011-09-30 LAB — BASIC METABOLIC PANEL
CO2: 28 mEq/L (ref 19–32)
Calcium: 8.2 mg/dL — ABNORMAL LOW (ref 8.4–10.5)
Creatinine, Ser: 0.62 mg/dL (ref 0.50–1.10)

## 2011-09-30 MED ORDER — METHOCARBAMOL 500 MG PO TABS
750.0000 mg | ORAL_TABLET | Freq: Four times a day (QID) | ORAL | Status: DC | PRN
  Administered 2011-09-30 – 2011-10-02 (×6): 750 mg via ORAL
  Filled 2011-09-30: qty 2
  Filled 2011-09-30: qty 1.5
  Filled 2011-09-30: qty 1
  Filled 2011-09-30: qty 2
  Filled 2011-09-30: qty 1.5
  Filled 2011-09-30: qty 1
  Filled 2011-09-30 (×3): qty 2

## 2011-09-30 MED ORDER — PROMETHAZINE HCL 25 MG/ML IJ SOLN
12.5000 mg | Freq: Four times a day (QID) | INTRAMUSCULAR | Status: DC | PRN
  Administered 2011-09-30 (×3): 12.5 mg via INTRAVENOUS
  Filled 2011-09-30 (×3): qty 1

## 2011-09-30 MED ORDER — ALUM & MAG HYDROXIDE-SIMETH 200-200-20 MG/5ML PO SUSP
30.0000 mL | Freq: Four times a day (QID) | ORAL | Status: DC | PRN
  Filled 2011-09-30: qty 30

## 2011-09-30 NOTE — Progress Notes (Signed)
Subjective: Patient reports Patient is awake alert complaining of back pain but no leg pain she also is complaining of nausea and reflux.  Objective: Vital signs in last 24 hours: Temp:  [97.2 F (36.2 C)-98.4 F (36.9 C)] 98.1 F (36.7 C) (01/31 0600) Pulse Rate:  [70-89] 75  (01/31 0600) Resp:  [12-39] 18  (01/31 0600) BP: (86-143)/(50-80) 104/66 mmHg (01/31 0600) SpO2:  [91 %-100 %] 96 % (01/31 0600) Weight:  [88.451 kg (195 lb)] 88.451 kg (195 lb) (01/30 2005)  Intake/Output from previous day: 01/30 0701 - 01/31 0700 In: 4500 [I.V.:3400; Blood:100; IV Piggyback:1000] Out: 1970 [Urine:1370; Drains:250; Blood:350] Intake/Output this shift:    Strength is 5 out of 5 wound is clean and dry drain output 250  Lab Results: No results found for this basename: WBC:2,HGB:2,HCT:2,PLT:2 in the last 72 hours BMET No results found for this basename: NA:2,K:2,CL:2,CO2:2,GLUCOSE:2,BUN:2,CREATININE:2,CALCIUM:2 in the last 72 hours  Studies/Results: Dg Lumbar Spine 2-3 Views  09/29/2011  *RADIOLOGY REPORT*  Clinical Data: L2-3, L3-4 L4-5 fusion.  LUMBAR SPINE - 2-3 VIEW  Comparison: None.  Findings: Five intraoperative spot images demonstrate changes of posterior fusion from what appears to be L1-S1.  No hardware complicating feature.  No acute bony abnormality.  Normal alignment.  IMPRESSION: Posterior lumbar fusion.  On these intraoperative spot images, this appears extend from L1-S1.  Original Report Authenticated By: Cyndie Chime, M.D.   Dg C-arm Gt 120 Min  09/29/2011  CLINICAL DATA: lumbar fusion   C-ARM GT 120 MIN  Fluoroscopy was utilized by the requesting physician.  No radiographic  interpretation.      Assessment/Plan: Postop day 1 from an anterior posterior fusion from L1-S1 doing fairly well condition of difficult pain control and nausea will add Maalox to her regimen for reflux and change her antiemetics from Zofran and Phenergan will immobilize physical therapy work on  finding her brace.  LOS: 1 day     Sydnee Lamour P 09/30/2011, 8:15 AM

## 2011-09-30 NOTE — Evaluation (Signed)
Physical Therapy Evaluation Patient Details Name: Rachel Allison MRN: [id number] DOB: [birth year] Today's Date: 09/30/2011  Problem List: There is no problem list on file for this patient.   Past Medical History:  Past Medical History  Diagnosis Date  . PONV (postoperative nausea and vomiting)   . Hypertension   . GERD (gastroesophageal reflux disease)   . Arthritis   . Fibromyalgia    Past Surgical History:  Past Surgical History  Procedure Date  . Abdominal hysterectomy 29Y RS  . Cholecystectomy 82YRS  . Toe arthroscopy     ROD 11  . Anterior lat lumbar fusion 09/29/2011    Procedure: ANTERIOR LATERAL LUMBAR FUSION 2 LEVELS;  Surgeon: Mariam Dollar, MD;  Location: [facility];  Service: Neurosurgery;  Laterality: N/A;  Lumbar two- three, three-four,  Anterior Lateral Interbody Fusion    PT Assessment/Plan/Recommendation PT Assessment Clinical Impression Statement: Pt is a 56 y/o female admitted for lumbar fusion with c/o 6/10 pain in lower back.  Pt will benefit from acute PT services to improve overall mobility and maximize independence to prepare for safe d/c home. PT Recommendation/Assessment: Patient will need skilled PT in the acute care venue PT Problem List: Decreased activity tolerance;Decreased balance;Decreased mobility;Decreased knowledge of use of DME;Pain PT Therapy Diagnosis : Difficulty walking;Abnormality of gait;Generalized weakness;Acute pain PT Plan PT Frequency: Min 5X/week PT Treatment/Interventions: DME instruction;Gait training;Stair training;Functional mobility training;Therapeutic activities;Therapeutic exercise;Balance training;Patient/family education PT Recommendation Follow Up Recommendations: Home health PT Equipment Recommended: 3 in 1 bedside comode PT Goals  Acute Rehab PT Goals PT Goal Formulation: With patient/family Time For Goal Achievement: 7 days Pt will Roll Supine to Left Side: with modified independence PT Goal: Rolling Supine to  Left Side - Progress: Goal set today Pt will go Supine/Side to Sit: with modified independence PT Goal: Supine/Side to Sit - Progress: Goal set today Pt will go Sit to Supine/Side: with modified independence PT Goal: Sit to Supine/Side - Progress: Goal set today Pt will go Sit to Stand: with modified independence PT Goal: Sit to Stand - Progress: Goal set today Pt will go Stand to Sit: with modified independence PT Goal: Stand to Sit - Progress: Goal set today Pt will Ambulate: >150 feet;with modified independence;with least restrictive assistive device PT Goal: Ambulate - Progress: Goal set today Pt will Go Up / Down Stairs: 6-9 stairs;with min assist;with least restrictive assistive device PT Goal: Up/Down Stairs - Progress: Goal set today  PT Evaluation Precautions/Restrictions  Precautions Precautions: Back Required Braces or Orthoses: Yes Spinal Brace: Lumbar corset;Applied in sitting position Restrictions Weight Bearing Restrictions: No Prior Functioning  Home Living Lives With: Spouse;Daughter Type of Home: Mobile home Home Layout: One level Home Access: Stairs to enter Entrance Stairs-Rails: Right;Left;Can reach both Entrance Stairs-Number of Steps: 10 Bathroom Shower/Tub: Tub/shower unit;Walk-in shower Bathroom Toilet: Standard Home Adaptive Equipment: Walker - rolling;Crutches Additional Comments: Husband unable to provide physical assist.  Prior Function Level of Independence: Independent with basic ADLs;Independent with homemaking with ambulation;Independent with transfers;Independent with gait Able to Take Stairs?: Yes Driving: Yes Comments: Stays home and takes care of husband. Cognition Cognition Arousal/Alertness: Awake/alert Overall Cognitive Status: Appears within functional limits for tasks assessed Orientation Level: Oriented X4 Sensation/Coordination Sensation Light Touch: Appears Intact Extremity Assessment RUE Assessment RUE Assessment: Within  Functional Limits LUE Assessment LUE Assessment: Within Functional Limits RLE Assessment RLE Assessment: Not tested LLE Assessment LLE Assessment: Not tested Mobility (including Balance) Bed Mobility Bed Mobility: No Transfers Transfers: Yes Sit to Stand: 4:  Min assist;With armrests;From chair/3-in-1;From bed;With upper extremity assist Sit to Stand Details (indicate cue type and reason): (A) to initiate transfer with max cues for hand placement Stand to Sit: 4: Min assist;To chair/3-in-1;With armrests Stand to Sit Details: (A) to slowly descend to chair with cues for UE placement Ambulation/Gait Ambulation/Gait: Yes Ambulation/Gait Assistance: 4: Min assist Ambulation/Gait Assistance Details (indicate cue type and reason): minguard for safety with cues for upright posture and increase gait speed with step through gait.  Pt initially step to gait with shuffle steps and encouraged increase gait speed. Ambulation Distance (Feet): 60 Feet Assistive device: Rolling walker Gait Pattern: Step-to pattern;Shuffle Gait velocity: decrease but able to increase after encouragement Stairs: No Wheelchair Mobility Wheelchair Mobility: No  Posture/Postural Control Posture/Postural Control: No significant limitations Balance Balance Assessed: No Exercise    End of Session PT - End of Session Equipment Utilized During Treatment: Gait belt;Back brace Activity Tolerance: Patient tolerated treatment well Patient left: in chair;with call bell in reach;with family/visitor present Nurse Communication: Mobility status for transfers;Mobility status for ambulation General Behavior During Session: [facility] for tasks performed Cognition: [facility] for tasks performed  Bernadette Gores 09/30/2011, 2:18 PM [phone]

## 2011-09-30 NOTE — Progress Notes (Signed)
Occupational Therapy Evaluation Patient Details Name: Rachel Allison MRN: [id number] DOB: [birth year] Today's Date: 09/30/2011  Problem List: There is no problem list on file for this patient.   Past Medical History:  Past Medical History  Diagnosis Date  . PONV (postoperative nausea and vomiting)   . Hypertension   . GERD (gastroesophageal reflux disease)   . Arthritis   . Fibromyalgia    Past Surgical History:  Past Surgical History  Procedure Date  . Abdominal hysterectomy 29Y RS  . Cholecystectomy 29YRS  . Toe arthroscopy     ROD 11    OT Assessment/Plan/Recommendation OT Assessment Clinical Impression Statement: Pt s/p L1-S1 fusion.  Pt performing functional transfers with min A and requiring mod A with LB ADLs due to back precautions.  Pt will benefit from acute OT to increase I and safety with functional transfers and ADLs while maintaining back precautions in prep for d/c home at mod I level.  OT Recommendation/Assessment: Patient will need skilled OT in the acute care venue OT Problem List: Decreased activity tolerance;Decreased knowledge of use of DME or AE;Decreased knowledge of precautions;Pain OT Therapy Diagnosis : Acute pain OT Plan OT Frequency: Min 2X/week OT Treatment/Interventions: Self-care/ADL training;DME and/or AE instruction;Therapeutic activities;Patient/family education OT Recommendation Follow Up Recommendations: Home health OT;Supervision/Assistance - 24 hour Equipment Recommended: 3 in 1 bedside comode Individuals Consulted Consulted and Agree with Results and Recommendations: Patient OT Goals Acute Rehab OT Goals OT Goal Formulation: With patient Time For Goal Achievement: 7 days ADL Goals Pt Will Perform Grooming: with modified independence;Standing at sink (while maintaining back safety precautions) ADL Goal: Grooming - Progress: Goal set today Pt Will Perform Lower Body Bathing: with modified independence;Sit to stand from chair;Sit to  stand from bed;with adaptive equipment ADL Goal: Lower Body Bathing - Progress: Goal set today Pt Will Perform Lower Body Dressing: with modified independence;Sit to stand from chair;Sit to stand from bed;with adaptive equipment ADL Goal: Lower Body Dressing - Progress: Goal set today Pt Will Transfer to Toilet: with modified independence;Ambulation;with DME;3-in-1;Maintaining back safety precautions ADL Goal: Toilet Transfer - Progress: Goal set today Additional ADL Goal #1: Pt will don/doff back brace with mod I sitting EOB. ADL Goal: Additional Goal #1 - Progress: Goal set today Miscellaneous OT Goals Miscellaneous OT Goal #1: Pt will perform bed mobility wiht mod I in prep for EOB ADLs. OT Goal: Miscellaneous Goal #1 - Progress: Goal set today  OT Evaluation Precautions/Restrictions  Precautions Precautions: Back Required Braces or Orthoses: Yes Spinal Brace: Lumbar corset;Applied in sitting position Restrictions Weight Bearing Restrictions: No Prior Functioning Home Living Lives With: Spouse;Daughter (Husband is disabled.  Daughter works (3) 12 hour shifts.) Type of Home: Mobile home Home Layout: One level Home Access: Stairs to enter Entrance Stairs-Rails: Right;Left;Can reach both Entrance Stairs-Number of Steps: 10 Bathroom Shower/Tub: Tub/shower unit;Walk-in shower Bathroom Toilet: Standard Home Adaptive Equipment: Walker - rolling;Crutches Additional Comments: Husband unable to provide physical assist.  Prior Function Level of Independence: Independent with basic ADLs;Independent with homemaking with ambulation;Independent with transfers;Independent with gait Able to Take Stairs?: Yes Driving: Yes Comments: Stays home and takes care of husband. ADL ADL Grooming: Simulated;Minimal assistance Grooming Details (indicate cue type and reason): min assist for balance and steadying at sink. Where Assessed - Grooming: Standing at sink Lower Body Bathing: Simulated;Moderate  assistance Lower Body Bathing Details (indicate cue type and reason): Mod assist due to back precautions Where Assessed - Lower Body Bathing: Sit to stand from bed Lower Body Dressing: Simulated;Moderate  assistance Lower Body Dressing Details (indicate cue type and reason): Mod assist due to back precautions Where Assessed - Lower Body Dressing: Sit to stand from bed Toilet Transfer: Simulated;Minimal assistance Toilet Transfer Details (indicate cue type and reason): Hand held min assist from bed to chair.   Toilet Transfer Method: Stand pivot ADL Comments: Pt donned back brace with min A sitting EOB. Vision/Perception    Cognition Cognition Arousal/Alertness: Awake/alert Overall Cognitive Status: Appears within functional limits for tasks assessed Orientation Level: Oriented X4 Sensation/Coordination Coordination Gross Motor Movements are Fluid and Coordinated: Yes (bil. UE) Fine Motor Movements are Fluid and Coordinated: Yes (bil. UE) Extremity Assessment RUE Assessment RUE Assessment: Within Functional Limits LUE Assessment LUE Assessment: Within Functional Limits Mobility  Bed Mobility Bed Mobility: Yes Rolling Left: 4: Min assist;With rail Rolling Left Details (indicate cue type and reason): Min assist to perform log roll. VC for sequencing Left Sidelying to Sit: 4: Min assist;HOB flat;With rails Left Sidelying to Sit Details (indicate cue type and reason): Min assist to lift trunk off bed.  VC for sequencing and to push through L UE  Sitting - Scoot to Edge of Bed: 5: Supervision Transfers Transfers: Yes Sit to Stand: 4: Min assist;With armrests;From chair/3-in-1;From bed;With upper extremity assist Sit to Stand Details (indicate cue type and reason): Min assist for steadying. Stand to Sit: 4: Min assist;To chair/3-in-1;With armrests Stand to Sit Details: VC for maintaining back precautions. Exercises   End of Session OT - End of Session Equipment Utilized During  Treatment: Back brace Activity Tolerance: Patient limited by pain;Patient tolerated treatment well Patient left: in chair;with call bell in reach;with family/visitor present Nurse Communication: Mobility status for transfers General Behavior During Session: [facility] for tasks performed Cognition: [facility] for tasks performed   Cipriano Mile 09/30/2011, 10:28 AM  09/30/2011 Cipriano Mile OTR/L Pager [phone] Office [phone]

## 2011-09-30 NOTE — Progress Notes (Signed)
Patient was very restless last nite. Lethargic and complaining of chest burning from burping, nausea. Medicated several times. Encouraged to sit up in a chair to help with the gas. Assisted up in chair twice which helped. Bedtime meds held due to patient being so drowsy.

## 2011-10-01 NOTE — Progress Notes (Signed)
Physical Therapy Treatment Patient Details Name: Rachel Allison MRN: [id number] DOB: 01-06-1956 Today's Date: 10/01/2011  PT Assessment/Plan  PT - Assessment/Plan Comments on Treatment Session: Pt progressing well with PT. Pt very motivated to do all she can on her own. Pt sons helpful throughout.  PT Plan: Discharge plan remains appropriate PT Frequency: Min 5X/week Follow Up Recommendations: Home health PT Equipment Recommended: 3 in 1 bedside comode PT Goals  Acute Rehab PT Goals PT Goal: Rolling Supine to Left Side - Progress: Progressing toward goal PT Goal: Supine/Side to Sit - Progress: Progressing toward goal PT Goal: Sit to Stand - Progress: Progressing toward goal PT Goal: Stand to Sit - Progress: Progressing toward goal PT Goal: Ambulate - Progress: Progressing toward goal  PT Treatment Precautions/Restrictions  Precautions Precautions: Back Precaution Comments: Pt able to recall all precautions  and able to follow with mobility.  Required Braces or Orthoses: Yes Spinal Brace: Lumbar corset;Applied in sitting position Restrictions Weight Bearing Restrictions: No Mobility (including Balance) Bed Mobility Rolling Left: 5: Supervision;With rail Left Sidelying to Sit: 5: Supervision;With rails Sitting - Scoot to Edge of Bed: 5: Supervision Transfers Sit to Stand: 5: Supervision;From bed;From chair/3-in-1;With upper extremity assist Sit to Stand Details (indicate cue type and reason): Cues for safe technique Stand to Sit: 5: Supervision;To chair/3-in-1;Without upper extremity assist Stand to Sit Details: Cues for safe positioning before sitting Ambulation/Gait Ambulation/Gait: Yes Ambulation/Gait Assistance: 5: Supervision Ambulation/Gait Assistance Details (indicate cue type and reason): Cues to increase gait speed and step length Ambulation Distance (Feet): 190 Feet Assistive device: Rolling walker Gait Pattern: Step-through pattern;Decreased stride length Gait  velocity: decreased Stairs: No    Exercise    End of Session PT - End of Session Equipment Utilized During Treatment: Gait belt;Back brace Activity Tolerance: Patient tolerated treatment well Patient left: in chair;with family/visitor present Nurse Communication: Mobility status for transfers;Mobility status for ambulation General Behavior During Session: [facility] for tasks performed Cognition: [facility] for tasks performed  Robinette, Adline Potter 10/01/2011, 12:59 PM 10/01/2011 Fredrich Birks PTA [phone] pager [phone] office

## 2011-10-01 NOTE — Progress Notes (Signed)
Pt smokes 3/4 ppd and very interested in quitting. She is in ation stage. Husband also smokes but neither of them smoke inside their house. Recommended 21 mg patch x 6 weeks, 14 mg patch x 2 weeks and 7 mg patch x 2 weeks. Discussed and wrote down patch use instructions for the pt. Husband doesn't seem interested in quitting. Referred to 1-800 quit now for f/u and support. Discussed oral fixation substitutes, second hand smoke and in home smoking policy. Reviewed and gave pt Written education/contact information.

## 2011-10-01 NOTE — Progress Notes (Signed)
Subjective: Patient reports She's doing okay she's denies any leg pain back pain is getting better she mobilize with physical therapy yesterday she has not passing gas but she does feel her bowels moving.  Objective: Vital signs in last 24 hours: Temp:  [98.2 F (36.8 C)-99.2 F (37.3 C)] 98.6 F (37 C) (02/01 0600) Pulse Rate:  [70-97] 82  (02/01 0600) Resp:  [18-19] 18  (02/01 0600) BP: (98-105)/(62-70) 100/62 mmHg (02/01 0600) SpO2:  [93 %-98 %] 97 % (02/01 0200)  Intake/Output from previous day: 01/31 0701 - 02/01 0700 In: 3 [I.V.:3] Out: 1175 [Urine:1175] Intake/Output this shift:    Strength is 5 out of 5 wound is clean and dry  Lab Results:  [facility] 09/30/11 0844  WBC 15.0*  HGB 10.2*  HCT 30.9*  PLT 219   BMET  Basename 09/30/11 0844  NA 138  K 4.2  CL 104  CO2 28  GLUCOSE 122*  BUN 9  CREATININE 0.62  CALCIUM 8.2*    Studies/Results: Dg Lumbar Spine 2-3 Views  09/29/2011  *RADIOLOGY REPORT*  Clinical Data: L2-3, L3-4 L4-5 fusion.  LUMBAR SPINE - 2-3 VIEW  Comparison: None.  Findings: Five intraoperative spot images demonstrate changes of posterior fusion from what appears to be L1-S1.  No hardware complicating feature.  No acute bony abnormality.  Normal alignment.  IMPRESSION: Posterior lumbar fusion.  On these intraoperative spot images, this appears extend from L1-S1.  Original Report Authenticated By: Cyndie Chime, M.D.   Dg C-arm Gt 120 Min  09/29/2011  CLINICAL DATA: lumbar fusion   C-ARM GT 120 MIN  Fluoroscopy was utilized by the requesting physician.  No radiographic  interpretation.      Assessment/Plan: 56 year old female postop day 2 from L1-S1 fusion doing very well mobilized physical therapy still a fair amount of pain control issues drain output is still significant 250 cc over last 24 hours we'll continue to mobilize. Labs are all within normal limits except is a mild anemia.  LOS: 2 days     Lenwood Balsam P 10/01/2011, 8:16  AM

## 2011-10-01 NOTE — Progress Notes (Signed)
Occupational Therapy Treatment Patient Details Name: Rachel Allison MRN: [id number] DOB: 1955-12-04 Today's Date: 10/01/2011  OT Assessment/Plan OT Assessment/Plan Comments on Treatment Session: Pt demonstrating increased independence with functional mobility and ADL tasks.  Continues to experience pain and fatigued easily today. OT Frequency: Min 2X/week Follow Up Recommendations: Home health OT;Supervision/Assistance - 24 hour Equipment Recommended: 3 in 1 bedside comode OT Goals ADL Goals Pt Will Perform Grooming: with modified independence;Standing at sink ADL Goal: Grooming - Progress: Met Pt Will Transfer to Toilet: with modified independence;Ambulation;with DME;3-in-1;Maintaining back safety precautions ADL Goal: Toilet Transfer - Progress: Progressing toward goals Miscellaneous OT Goals Miscellaneous OT Goal #1: Pt will perform bed mobility wiht mod I in prep for EOB ADLs. OT Goal: Miscellaneous Goal #1 - Progress: Progressing toward goals  OT Treatment Precautions/Restrictions  Precautions Precautions: Back Precaution Comments: Pt able to recall all precautions  and able to follow with mobility.  Required Braces or Orthoses: Yes Spinal Brace: Lumbar corset;Applied in sitting position Restrictions Weight Bearing Restrictions: No   ADL ADL Grooming: Performed;Modified independent;Wash/dry hands Where Assessed - Grooming: Standing at sink Toilet Transfer: Performed;Supervision/safety Toilet Transfer Method: Proofreader: Bedside commode Toileting - Clothing Manipulation: Performed;Supervision/safety Toileting - Clothing Manipulation Details (indicate cue type and reason): Supervision for maintaining back precautions. Where Assessed - Toileting Clothing Manipulation: Standing Toileting - Hygiene: Performed;Modified independent Where Assessed - Toileting Hygiene: Sit to stand from 3-in-1 or toilet Ambulation Related to ADLs: Supervision for  safety due to fatigue ADL Comments: Pt educated on use of reacher for LB ADL activity. Pt's husband reports that he has ordered one. Mobility  Bed Mobility Bed Mobility: No Transfers Sit to Stand: 5: Supervision;From chair/3-in-1 Stand to Sit: 5: Supervision;To bed;With upper extremity assist Stand to Sit Details: VC for hand placement Exercises    End of Session OT - End of Session Equipment Utilized During Treatment: Back brace Activity Tolerance: Patient limited by fatigue;Patient limited by pain Patient left: in bed;with call bell in reach;with family/visitor present Nurse Communication: Other (comment) (pain) General Behavior During Session: [facility] for tasks performed Cognition: [facility] for tasks performed  Cipriano Mile  10/01/2011, 4:38 PM 10/01/2011 Cipriano Mile OTR/L Pager [phone] Office [phone]

## 2011-10-02 MED ORDER — MAGNESIUM HYDROXIDE 400 MG/5ML PO SUSP
30.0000 mL | Freq: Every day | ORAL | Status: DC | PRN
  Administered 2011-10-02: 30 mL via ORAL
  Filled 2011-10-02: qty 30

## 2011-10-02 MED ORDER — MAGNESIUM HYDROXIDE NICU ORAL SYRINGE 400 MG/5 ML: 30.0000 mL | Freq: Every day | ORAL | Status: DC | PRN

## 2011-10-02 MED ORDER — BISACODYL 10 MG RE SUPP
10.0000 mg | Freq: Every day | RECTAL | Status: DC | PRN
  Filled 2011-10-02: qty 1

## 2011-10-02 NOTE — Progress Notes (Addendum)
Pt only drained approx 10 cc from Hemovac from 11am to 5:30pm. Hemovac D/C'd, no sutures to remove, pt tolerated well- no drainage during removal. Hemovac removed per verbal order from Dr Newell Coral this am. Will cont to monitor site. Additional tape applied for pressure at patient's request.  MoM and Dulcolax supp both given by this time today, Pt says she is passing gas. Will monitor for BM.   Minor, Yvette Rack

## 2011-10-02 NOTE — Progress Notes (Signed)
Filed Vitals:   10/01/11 2200 10/02/11 0200 10/02/11 0600 10/02/11 0936  BP: 126/76 118/70 108/71 121/73  Pulse: 80 74 85 85  Temp: 98.7 F (37.1 C) 98.4 F (36.9 C) 98.4 F (36.9 C) 98.1 F (36.7 C)  TempSrc: Oral Oral Oral Oral  Resp: 18 19 19 19   Height:      Weight:      SpO2: 92% 93% 95% 95%   Patient up at sink in room. Ambulate in the halls yesterday, but not yet today. Still has Hemovac drain in, unclear if there's been much drainage over the past 24 hours. Had been receiving Dilaudid IV, transition this morning to Percocet by mouth. No bowel movement yet, patient is been voiding urine with good control and without difficulty.  Nursing checked Hemovac drain, about 70 cc of serosanguineous fluid drainage into Hemovac drain over the past 24 hours.  Plan: Encouraged to ambulate. Have ordered milk of magnesia and Dulcolax suppository to be used as needed for constipation.  Hewitt Shorts, MD 10/02/2011, 11:09 AM

## 2011-10-02 NOTE — Progress Notes (Signed)
Pt has been receiving IV Dilaudid as her primary medicine for pain relief. Spoke with pt this am about trying her po pain meds as her primary pain relief and saving her IV medicine for intense pain not relieved by the po regimen. Pt agreed. Will cont to monitor pt and her pain throughout day.  Minor, Yvette Rack

## 2011-10-02 NOTE — Progress Notes (Signed)
Physical Therapy Treatment Patient Details Name: Rachel Allison MRN: [id number] DOB: March 08, 1956 Today's Date: 10/02/2011  PT Assessment/Plan  PT - Assessment/Plan Comments on Treatment Session: pt very motivated and moving well. Reviewed car transfer and stairs with pt and family.   PT Plan: Discharge plan remains appropriate PT Frequency: Min 5X/week Follow Up Recommendations: Home health PT Equipment Recommended: 3 in 1 bedside comode PT Goals  Acute Rehab PT Goals PT Goal: Rolling Supine to Left Side - Progress: Progressing toward goal PT Goal: Supine/Side to Sit - Progress: Progressing toward goal PT Goal: Sit to Supine/Side - Progress: Progressing toward goal PT Goal: Sit to Stand - Progress: Progressing toward goal PT Goal: Stand to Sit - Progress: Progressing toward goal PT Goal: Ambulate - Progress: Progressing toward goal PT Goal: Up/Down Stairs - Progress: Progressing toward goal  PT Treatment Precautions/Restrictions  Precautions Precautions: Back Precaution Comments: Pt able to recall all precautions  and able to follow with mobility.  Required Braces or Orthoses: Yes Spinal Brace: Lumbar corset;Applied in sitting position Restrictions Weight Bearing Restrictions: No Mobility (including Balance) Bed Mobility Bed Mobility: No Transfers Transfers: Yes Sit to Stand: 5: Supervision;With upper extremity assist;With armrests;From chair/3-in-1 Sit to Stand Details (indicate cue type and reason): demos good technique.   Stand to Sit: 5: Supervision;With upper extremity assist;With armrests;To chair/3-in-1 Stand to Sit Details: cues for positioning prior to sitting.   Ambulation/Gait Ambulation/Gait: Yes Ambulation/Gait Assistance: 5: Supervision Ambulation/Gait Assistance Details (indicate cue type and reason): cues for increased speed and decreased use of UEs Ambulation Distance (Feet): 250 Feet Assistive device: Rolling walker Gait Pattern: Step-through  pattern;Decreased stride length Gait velocity: decreased Stairs: Yes Stairs Assistance: 4: Min assist Stairs Assistance Details (indicate cue type and reason): cues for stair gait and safety Stair Management Technique: Two rails;Step to pattern;Forwards Number of Stairs: 2  Wheelchair Mobility Wheelchair Mobility: No  Posture/Postural Control Posture/Postural Control: No significant limitations Balance Balance Assessed: No Exercise    End of Session PT - End of Session Equipment Utilized During Treatment: Gait belt;Back brace Activity Tolerance: Patient tolerated treatment well Patient left: in chair;with call bell in reach;with family/visitor present Nurse Communication: Mobility status for transfers;Mobility status for ambulation General Behavior During Session: [facility] for tasks performed Cognition: [facility] for tasks performed  Sunny Schlein, [place] [phone] 10/02/2011, 11:57 AM

## 2011-10-03 MED ORDER — OXYCODONE-ACETAMINOPHEN 5-325 MG PO TABS: 2.0000 | ORAL_TABLET | ORAL | Status: AC | PRN

## 2011-10-03 MED ORDER — MAGIC MOUTHWASH
15.0000 mL | Freq: Four times a day (QID) | ORAL | Status: DC
  Administered 2011-10-03: 15 mL via ORAL
  Filled 2011-10-03 (×4): qty 15

## 2011-10-03 MED ORDER — MAGIC MOUTHWASH: 10.0000 mL | Freq: Four times a day (QID) | ORAL | Status: DC

## 2011-10-03 NOTE — Progress Notes (Signed)
OT Cancellation Note  Treatment cancelled today due to pt verbalizing that she knew all precautions and she would have adequate help at home to assist as needed with ADL.Burnett Corrente Tarnesha Ulloa, OTR/L  [phone] 10/03/2011 10/02/2011, 3:00 PM

## 2011-10-03 NOTE — Progress Notes (Signed)
Pt and family stated that pt will have 24 hour care at home (daughter is a Engineer, civil (consulting)). They state they have a walker and all equipment they would need at home already and deny home health PT.  Minor, Rachel Allison

## 2011-10-03 NOTE — Discharge Summary (Signed)
Physician Discharge Summary  Patient ID: Rachel Allison MRN: [id number] DOB/AGE: [birth year] 56 y.o.  Admit date: 09/29/2011 Discharge date: 10/03/2011  Admission Diagnoses: Lumbar spondylosis with radiculopathy L1-S1  Discharge Diagnoses: Lumbar spondylosis with radiculopathy L1 to S 1. Oral thrush. Active Problems:  * No active hospital problems. *    Discharged Condition: good  Hospital Course: A shunt was admitted to undergo surgical decompression and stabilization from L1-S1 this was done with anterolateral arthrodesis at L2-3 L3-4 and transforaminal decompression and fusion at L4-5 and L5-S1 and pedicle screw fixation L1-S1. She tolerated the surgery well and is discharged at this time. She did have the development of some oral thrush which is being treated.  Consults: None  Significant Diagnostic Studies: None  Treatments: surgery: Decompression and stabilization of lumbar spine from L1-S1 with anterolateral decompression arthrodesis L2-3 and L3-4 transforaminal decompression and fusion L4-5 and L5-S1. Pedicle screw fixation L1-S1. Posterior lateral arthrodesis L1-S1  Discharge Exam: Blood pressure 98/60, pulse 78, temperature 98.5 F (36.9 C), temperature source Oral, resp. rate 20, height 5\' 3"  (1.6 m), weight 88.451 kg (195 lb), SpO2 99.00%. Incisions are clean and dry motor function reveals good strength in iliopsoas quadriceps tibialis anterior and gastrocs  Disposition: Discharge home  Discharge Orders    Future Orders Please Complete By Expires   Diet - low sodium heart healthy      Increase activity slowly      Discharge instructions      Comments:   Sit straight walk straight stand straight. Mind your posture. Okay remove dressing and shower. No lifting greater than 10 pounds.   Call MD for:  temperature >100.4      Call MD for:  severe uncontrolled pain      Call MD for:  redness, tenderness, or signs of infection (pain, swelling, redness, odor or green/yellow  discharge around incision site)        Medication List  As of 10/03/2011 11:55 AM   TAKE these medications         DULoxetine 60 MG capsule   Commonly known as: CYMBALTA   Take 60 mg by mouth 2 (two) times daily.      esomeprazole 40 MG capsule   Commonly known as: NEXIUM   Take 40 mg by mouth daily before breakfast.      magic mouthwash Soln   Take 10 mLs by mouth QID. Swish and swallow      methocarbamol 500 MG tablet   Commonly known as: ROBAXIN   Take 500 mg by mouth at bedtime as needed. Muscle spasms      olmesartan 40 MG tablet   Commonly known as: BENICAR   Take 40 mg by mouth daily.      oxyCODONE-acetaminophen 10-325 MG per tablet   Commonly known as: PERCOCET   Take 1 tablet by mouth every 4 (four) hours as needed.      oxyCODONE-acetaminophen 5-325 MG per tablet   Commonly known as: PERCOCET   Take 2 tablets by mouth every 3 (three) hours as needed for pain.      zolpidem 10 MG tablet   Commonly known as: AMBIEN   Take 10 mg by mouth at bedtime.             SignedStefani Dama 10/03/2011, 11:55 AM

## 2011-10-04 MED FILL — Sodium Chloride IV Soln 0.9%: INTRAVENOUS | Qty: 1000 | Status: AC

## 2011-10-04 MED FILL — Heparin Sodium (Porcine) Inj 1000 Unit/ML: INTRAMUSCULAR | Qty: 30 | Status: AC

## 2011-10-04 MED FILL — Sodium Chloride Irrigation Soln 0.9%: Qty: 3000 | Status: AC

## 2011-11-09 ENCOUNTER — Ambulatory Visit
Admission: RE | Admit: 2011-11-09 | Discharge: 2011-11-09 | Disposition: A | Discharge status: Home / Self Care | Payer: Managed Care, Other (non HMO) | Source: Ambulatory Visit | Attending: Neurological Surgery | Admitting: Neurological Surgery

## 2011-11-09 ENCOUNTER — Other Ambulatory Visit: Payer: Self-pay | Admitting: Neurological Surgery

## 2011-11-09 ENCOUNTER — Other Ambulatory Visit: Payer: Self-pay | Admitting: Neurosurgery

## 2011-11-09 DIAGNOSIS — M545 Low back pain: Secondary | ICD-10-CM

## 2012-02-08 ENCOUNTER — Other Ambulatory Visit: Payer: Self-pay | Admitting: Neurosurgery

## 2012-03-06 ENCOUNTER — Encounter (HOSPITAL_COMMUNITY): Payer: Self-pay

## 2012-03-06 ENCOUNTER — Encounter (HOSPITAL_COMMUNITY)
Admission: RE | Admit: 2012-03-06 | Discharge: 2012-03-06 | Disposition: A | Discharge status: Home / Self Care | Payer: Managed Care, Other (non HMO) | Source: Ambulatory Visit | Attending: Neurosurgery | Admitting: Neurosurgery

## 2012-03-06 LAB — CBC
HCT: 38.8 % (ref 36.0–46.0)
MCHC: 33.2 g/dL (ref 30.0–36.0)
MCV: 87 fL (ref 78.0–100.0)
Platelets: 284 10*3/uL (ref 150–400)
RDW: 14.3 % (ref 11.5–15.5)

## 2012-03-06 LAB — BASIC METABOLIC PANEL
BUN: 13 mg/dL (ref 6–23)
CO2: 24 mEq/L (ref 19–32)
Calcium: 9.5 mg/dL (ref 8.4–10.5)
Chloride: 103 mEq/L (ref 96–112)
Creatinine, Ser: 0.81 mg/dL (ref 0.50–1.10)
GFR calc Af Amer: >90 mL/min (ref >90)

## 2012-03-06 LAB — SURGICAL PCR SCREEN: MRSA, PCR: NEGATIVE

## 2012-03-06 NOTE — Consult Note (Signed)
Anesthesia Note:  Patient is scheduled for 3 level ACDF by Dr. Wynetta Emery on 03/13/12.  She is s/p ALIF in January 2013.  Other history includes smoking, HTN, fibromyalgia, GERD, arthritis, post-operative N/V.  She also reports that she is claustrophobic.  She is fearful that she will require an ETT post-operatively.  He brother recently had this procedure and had to remain intubated for an additional 24 hours or so for neck swelling.  Confirmed with the Anesthesiologist that it is not routine practice to keep patients' intubated following this procedure;however, she understands that if at the end of her procedure it is felt necessary then her Anesthesiologist will do whatever he or she thinks is appropriate for her best outcome.  Labs are still pending.    Shonna Chock, PA-C

## 2012-03-06 NOTE — Pre-Procedure Instructions (Signed)
20 Rachel Allison  03/06/2012   Your procedure is scheduled on:  March 13, 2012 Monday at 1201 PM.   Report to [facility] at 1000 AM.  Call this number if you have problems the morning of surgery: [phone]   Remember:   Do not eat food or drink:After Midnight.Sunday      Take these medicines the morning of surgery with A SIP OF WATER: Diltiazem [Dilacor XR[, Cymbalta, and Nexium   Do not wear jewelry, make-up or nail polish.  Do not wear lotions, powders, or perfumes. You may wear deodorant.  Do not shave 48 hours prior to surgery. Men may shave face and neck.  Do not bring valuables to the hospital.  Contacts, dentures or bridgework may not be worn into surgery.  Leave suitcase in the car. After surgery it may be brought to your room.  For patients admitted to the hospital, checkout time is 11:00 AM the day of discharge.   Patients discharged the day of surgery will not be allowed to drive home.    Special Instructions: CHG Shower Use Special Wash: 1/2 bottle night before surgery and 1/2 bottle morning of surgery.   Please read over the following fact sheets that you were given: Pain Booklet, Coughing and Deep Breathing, MRSA Information and Surgical Site Infection Prevention

## 2012-03-12 MED ORDER — TOBRAMYCIN SULFATE 80 MG/2ML IJ SOLN
80.0000 mg | Freq: Once | INTRAVENOUS | Status: AC
  Administered 2012-03-13: 80 mg via INTRAVENOUS
  Filled 2012-03-12: qty 2

## 2012-03-12 MED ORDER — VANCOMYCIN HCL 500 MG IV SOLR
500.0000 mg | INTRAVENOUS | Status: AC
  Administered 2012-03-13: 500 mg via INTRAVENOUS
  Filled 2012-03-12: qty 500

## 2012-03-13 ENCOUNTER — Encounter (HOSPITAL_COMMUNITY): Payer: Self-pay | Admitting: Vascular Surgery

## 2012-03-13 ENCOUNTER — Encounter (HOSPITAL_COMMUNITY): Payer: Self-pay | Admitting: *Deleted

## 2012-03-13 ENCOUNTER — Ambulatory Visit (HOSPITAL_COMMUNITY): Payer: Managed Care, Other (non HMO)

## 2012-03-13 ENCOUNTER — Ambulatory Visit (HOSPITAL_COMMUNITY): Payer: Managed Care, Other (non HMO) | Admitting: Vascular Surgery

## 2012-03-13 ENCOUNTER — Ambulatory Visit (HOSPITAL_COMMUNITY)
Admission: RE | Admit: 2012-03-13 | Discharge: 2012-03-14 | Disposition: A | Discharge status: Home / Self Care | Payer: Managed Care, Other (non HMO) | Source: Ambulatory Visit | Attending: Neurosurgery | Admitting: Neurosurgery

## 2012-03-13 ENCOUNTER — Encounter (HOSPITAL_COMMUNITY)
Admission: RE | Disposition: A | Discharge status: Home / Self Care | Payer: Self-pay | Source: Ambulatory Visit | Attending: Neurosurgery

## 2012-03-13 DIAGNOSIS — F17200 Nicotine dependence, unspecified, uncomplicated: Secondary | ICD-10-CM | POA: Insufficient documentation

## 2012-03-13 DIAGNOSIS — IMO0001 Reserved for inherently not codable concepts without codable children: Secondary | ICD-10-CM | POA: Insufficient documentation

## 2012-03-13 DIAGNOSIS — I10 Essential (primary) hypertension: Secondary | ICD-10-CM | POA: Insufficient documentation

## 2012-03-13 DIAGNOSIS — K219 Gastro-esophageal reflux disease without esophagitis: Secondary | ICD-10-CM | POA: Insufficient documentation

## 2012-03-13 DIAGNOSIS — M47812 Spondylosis without myelopathy or radiculopathy, cervical region: Secondary | ICD-10-CM | POA: Insufficient documentation

## 2012-03-13 DIAGNOSIS — Z01812 Encounter for preprocedural laboratory examination: Secondary | ICD-10-CM | POA: Insufficient documentation

## 2012-03-13 HISTORY — PX: ANTERIOR CERVICAL DECOMP/DISCECTOMY FUSION: SHX1161

## 2012-03-13 SURGERY — ANTERIOR CERVICAL DECOMPRESSION/DISCECTOMY FUSION 3 LEVELS
Anesthesia: General | Site: Spine Cervical | Wound class: Clean

## 2012-03-13 MED ORDER — IRBESARTAN 75 MG PO TABS
75.0000 mg | ORAL_TABLET | Freq: Every day | ORAL | Status: DC
  Administered 2012-03-14: 75 mg via ORAL
  Filled 2012-03-13: qty 1

## 2012-03-13 MED ORDER — PHENYLEPHRINE HCL 10 MG/ML IJ SOLN
INTRAMUSCULAR | Status: DC | PRN
  Administered 2012-03-13 (×5): 80 ug via INTRAVENOUS
  Administered 2012-03-13: 40 ug via INTRAVENOUS
  Administered 2012-03-13: 80 ug via INTRAVENOUS
  Administered 2012-03-13: 40 ug via INTRAVENOUS

## 2012-03-13 MED ORDER — CYCLOBENZAPRINE HCL 10 MG PO TABS
ORAL_TABLET | ORAL | Status: AC
  Filled 2012-03-13: qty 1

## 2012-03-13 MED ORDER — FENTANYL CITRATE 0.05 MG/ML IJ SOLN
INTRAMUSCULAR | Status: DC | PRN
  Administered 2012-03-13: 100 ug via INTRAVENOUS
  Administered 2012-03-13 (×2): 50 ug via INTRAVENOUS

## 2012-03-13 MED ORDER — SODIUM CHLORIDE 0.9 % IJ SOLN
3.0000 mL | Freq: Two times a day (BID) | INTRAMUSCULAR | Status: DC
  Administered 2012-03-13: 3 mL via INTRAVENOUS

## 2012-03-13 MED ORDER — ONDANSETRON HCL 4 MG/2ML IJ SOLN: 4.0000 mg | INTRAMUSCULAR | Status: DC | PRN

## 2012-03-13 MED ORDER — DIPHENHYDRAMINE HCL 50 MG/ML IJ SOLN
INTRAMUSCULAR | Status: AC
  Filled 2012-03-13: qty 1

## 2012-03-13 MED ORDER — MIDAZOLAM HCL 5 MG/5ML IJ SOLN
INTRAMUSCULAR | Status: DC | PRN
  Administered 2012-03-13: 2 mg via INTRAVENOUS

## 2012-03-13 MED ORDER — DIPHENHYDRAMINE HCL 50 MG/ML IJ SOLN: 50.0000 mg | INTRAMUSCULAR | Status: DC

## 2012-03-13 MED ORDER — BACITRACIN 50000 UNITS IM SOLR
INTRAMUSCULAR | Status: AC
  Filled 2012-03-13: qty 1

## 2012-03-13 MED ORDER — THROMBIN 5000 UNITS EX SOLR
OROMUCOSAL | Status: DC | PRN
  Administered 2012-03-13: 13:00:00 via TOPICAL

## 2012-03-13 MED ORDER — HYDROMORPHONE HCL PF 1 MG/ML IJ SOLN
INTRAMUSCULAR | Status: AC
  Filled 2012-03-13: qty 1

## 2012-03-13 MED ORDER — MENTHOL 3 MG MT LOZG: 1.0000 | LOZENGE | OROMUCOSAL | Status: DC | PRN

## 2012-03-13 MED ORDER — HYDROMORPHONE HCL PF 1 MG/ML IJ SOLN: 0.5000 mg | INTRAMUSCULAR | Status: DC | PRN

## 2012-03-13 MED ORDER — SODIUM CHLORIDE 0.9 % IV SOLN
INTRAVENOUS | Status: AC
  Filled 2012-03-13: qty 500

## 2012-03-13 MED ORDER — 0.9 % SODIUM CHLORIDE (POUR BTL) OPTIME
TOPICAL | Status: DC | PRN
  Administered 2012-03-13: 1000 mL

## 2012-03-13 MED ORDER — DILTIAZEM HCL ER 180 MG PO CP24
180.0000 mg | ORAL_CAPSULE | Freq: Every day | ORAL | Status: DC
  Administered 2012-03-14: 180 mg via ORAL
  Filled 2012-03-13: qty 1

## 2012-03-13 MED ORDER — HYDROMORPHONE HCL PF 1 MG/ML IJ SOLN
0.2500 mg | INTRAMUSCULAR | Status: DC | PRN
  Administered 2012-03-13 (×4): 0.5 mg via INTRAVENOUS

## 2012-03-13 MED ORDER — DULOXETINE HCL 60 MG PO CPEP
60.0000 mg | ORAL_CAPSULE | Freq: Two times a day (BID) | ORAL | Status: DC
Start: 2012-03-13 — End: 2012-03-14
  Administered 2012-03-13 – 2012-03-14 (×2): 60 mg via ORAL
  Filled 2012-03-13 (×3): qty 1

## 2012-03-13 MED ORDER — LIDOCAINE HCL (CARDIAC) 20 MG/ML IV SOLN
INTRAVENOUS | Status: DC | PRN
  Administered 2012-03-13: 50 mg via INTRAVENOUS

## 2012-03-13 MED ORDER — PANTOPRAZOLE SODIUM 40 MG PO TBEC
40.0000 mg | DELAYED_RELEASE_TABLET | Freq: Every day | ORAL | Status: DC
  Administered 2012-03-14: 40 mg via ORAL
  Filled 2012-03-13: qty 1

## 2012-03-13 MED ORDER — ACETAMINOPHEN 650 MG RE SUPP: 650.0000 mg | RECTAL | Status: DC | PRN

## 2012-03-13 MED ORDER — ZOLPIDEM TARTRATE 5 MG PO TABS: 10.0000 mg | ORAL_TABLET | Freq: Every day | ORAL | Status: DC

## 2012-03-13 MED ORDER — OXYCODONE-ACETAMINOPHEN 5-325 MG PO TABS
ORAL_TABLET | ORAL | Status: AC
  Filled 2012-03-13: qty 2

## 2012-03-13 MED ORDER — HETASTARCH-ELECTROLYTES 6 % IV SOLN
INTRAVENOUS | Status: DC | PRN
  Administered 2012-03-13: 13:00:00 via INTRAVENOUS

## 2012-03-13 MED ORDER — ONDANSETRON HCL 4 MG/2ML IJ SOLN
INTRAMUSCULAR | Status: DC | PRN
  Administered 2012-03-13: 4 mg via INTRAVENOUS

## 2012-03-13 MED ORDER — ACETAMINOPHEN 325 MG PO TABS: 650.0000 mg | ORAL_TABLET | ORAL | Status: DC | PRN

## 2012-03-13 MED ORDER — PHENOL 1.4 % MT LIQD: 1.0000 | OROMUCOSAL | Status: DC | PRN

## 2012-03-13 MED ORDER — CEFAZOLIN SODIUM 1-5 GM-% IV SOLN
1.0000 g | Freq: Three times a day (TID) | INTRAVENOUS | Status: AC
  Administered 2012-03-13 – 2012-03-14 (×2): 1 g via INTRAVENOUS
  Filled 2012-03-13 (×3): qty 50

## 2012-03-13 MED ORDER — OXYCODONE-ACETAMINOPHEN 5-325 MG PO TABS
1.0000 | ORAL_TABLET | ORAL | Status: DC | PRN
  Administered 2012-03-13: 2 via ORAL
  Administered 2012-03-14: 1 via ORAL
  Administered 2012-03-14: 2 via ORAL
  Administered 2012-03-14: 1 via ORAL
  Filled 2012-03-13 (×2): qty 2

## 2012-03-13 MED ORDER — ONDANSETRON HCL 4 MG/2ML IJ SOLN: 4.0000 mg | Freq: Four times a day (QID) | INTRAMUSCULAR | Status: DC | PRN

## 2012-03-13 MED ORDER — DEXAMETHASONE SODIUM PHOSPHATE 10 MG/ML IJ SOLN: 10.0000 mg | INTRAMUSCULAR | Status: DC

## 2012-03-13 MED ORDER — SODIUM CHLORIDE 0.9 % IR SOLN
Status: DC | PRN
  Administered 2012-03-13: 13:00:00

## 2012-03-13 MED ORDER — LACTATED RINGERS IV SOLN
INTRAVENOUS | Status: DC | PRN
  Administered 2012-03-13: 11:00:00 via INTRAVENOUS

## 2012-03-13 MED ORDER — DEXAMETHASONE SODIUM PHOSPHATE 10 MG/ML IJ SOLN
INTRAMUSCULAR | Status: AC
  Administered 2012-03-13: 10 mg via INTRAVENOUS
  Filled 2012-03-13: qty 1

## 2012-03-13 MED ORDER — ROCURONIUM BROMIDE 100 MG/10ML IV SOLN
INTRAVENOUS | Status: DC | PRN
  Administered 2012-03-13: 10 mg via INTRAVENOUS
  Administered 2012-03-13: 5 mg via INTRAVENOUS
  Administered 2012-03-13: 50 mg via INTRAVENOUS

## 2012-03-13 MED ORDER — SODIUM CHLORIDE 0.9 % IJ SOLN
3.0000 mL | INTRAMUSCULAR | Status: DC | PRN
  Administered 2012-03-13: 3 mL via INTRAVENOUS

## 2012-03-13 MED ORDER — THROMBIN 20000 UNITS EX KIT
PACK | CUTANEOUS | Status: DC | PRN
  Administered 2012-03-13: 13:00:00 via TOPICAL

## 2012-03-13 MED ORDER — CYCLOBENZAPRINE HCL 10 MG PO TABS
10.0000 mg | ORAL_TABLET | Freq: Three times a day (TID) | ORAL | Status: DC | PRN
  Administered 2012-03-13 – 2012-03-14 (×3): 10 mg via ORAL
  Filled 2012-03-13 (×2): qty 1

## 2012-03-13 MED ORDER — PROPOFOL 10 MG/ML IV EMUL
INTRAVENOUS | Status: DC | PRN
  Administered 2012-03-13: 200 mg via INTRAVENOUS

## 2012-03-13 SURGICAL SUPPLY — 70 items
ADH SKN CLS APL DERMABOND .7 (GAUZE/BANDAGES/DRESSINGS)
ADH SKN CLS LQ APL DERMABOND (GAUZE/BANDAGES/DRESSINGS) ×2
APL SKNCLS STERI-STRIP NONHPOA (GAUZE/BANDAGES/DRESSINGS) ×1
BAG DECANTER FOR FLEXI CONT (MISCELLANEOUS) ×2 IMPLANT
BENZOIN TINCTURE PRP APPL 2/3 (GAUZE/BANDAGES/DRESSINGS) ×2 IMPLANT
BRUSH SCRUB EZ PLAIN DRY (MISCELLANEOUS) ×2 IMPLANT
BUR MATCHSTICK NEURO 3.0 LAGG (BURR) ×2 IMPLANT
CANISTER SUCTION 2500CC (MISCELLANEOUS) ×2 IMPLANT
CLOTH BEACON ORANGE TIMEOUT ST (SAFETY) ×2 IMPLANT
CONT SPEC 4OZ CLIKSEAL STRL BL (MISCELLANEOUS) ×2 IMPLANT
DECANTER SPIKE VIAL GLASS SM (MISCELLANEOUS) ×2 IMPLANT
DERMABOND ADHESIVE PROPEN (GAUZE/BANDAGES/DRESSINGS) ×2
DERMABOND ADVANCED (GAUZE/BANDAGES/DRESSINGS)
DERMABOND ADVANCED .7 DNX12 (GAUZE/BANDAGES/DRESSINGS) IMPLANT
DERMABOND ADVANCED .7 DNX6 (GAUZE/BANDAGES/DRESSINGS) IMPLANT
DRAIN SNY WOU 7FLT (WOUND CARE) ×1 IMPLANT
DRAPE C-ARM 42X72 X-RAY (DRAPES) ×4 IMPLANT
DRAPE LAPAROTOMY 100X72 PEDS (DRAPES) ×2 IMPLANT
DRAPE MICROSCOPE ZEISS OPMI (DRAPES) ×2 IMPLANT
DRAPE POUCH INSTRU U-SHP 10X18 (DRAPES) ×2 IMPLANT
DRILL BIT (BIT) ×1 IMPLANT
DRSG OPSITE 4X5.5 SM (GAUZE/BANDAGES/DRESSINGS) ×2 IMPLANT
ELECT COATED BLADE 2.86 ST (ELECTRODE) ×2 IMPLANT
ELECT REM PT RETURN 9FT ADLT (ELECTROSURGICAL) ×2
ELECTRODE REM PT RTRN 9FT ADLT (ELECTROSURGICAL) ×1 IMPLANT
EVACUATOR SILICONE 100CC (DRAIN) ×1 IMPLANT
GAUZE SPONGE 4X4 16PLY XRAY LF (GAUZE/BANDAGES/DRESSINGS) ×1 IMPLANT
GLOVE BIO SURGEON STRL SZ8 (GLOVE) ×2 IMPLANT
GLOVE BIOGEL PI IND STRL 7.0 (GLOVE) IMPLANT
GLOVE BIOGEL PI IND STRL 7.5 (GLOVE) ×1 IMPLANT
GLOVE BIOGEL PI IND STRL 8 (GLOVE) IMPLANT
GLOVE BIOGEL PI INDICATOR 7.0 (GLOVE) ×1
GLOVE BIOGEL PI INDICATOR 7.5 (GLOVE) ×1
GLOVE BIOGEL PI INDICATOR 8 (GLOVE) ×1
GLOVE ECLIPSE 7.5 STRL STRAW (GLOVE) ×6 IMPLANT
GLOVE ECLIPSE 8.5 STRL (GLOVE) ×1 IMPLANT
GLOVE EXAM NITRILE LRG STRL (GLOVE) IMPLANT
GLOVE EXAM NITRILE MD LF STRL (GLOVE) IMPLANT
GLOVE EXAM NITRILE XL STR (GLOVE) IMPLANT
GLOVE EXAM NITRILE XS STR PU (GLOVE) IMPLANT
GLOVE INDICATOR 8.5 STRL (GLOVE) ×2 IMPLANT
GOWN BRE IMP SLV AUR LG STRL (GOWN DISPOSABLE) IMPLANT
GOWN BRE IMP SLV AUR XL STRL (GOWN DISPOSABLE) ×4 IMPLANT
GOWN STRL REIN 2XL LVL4 (GOWN DISPOSABLE) ×1 IMPLANT
HEAD HALTER (SOFTGOODS) ×2 IMPLANT
HEMOSTAT POWDER KIT SURGIFOAM (HEMOSTASIS) ×1 IMPLANT
KIT BASIN OR (CUSTOM PROCEDURE TRAY) ×2 IMPLANT
KIT ROOM TURNOVER OR (KITS) ×2 IMPLANT
NDL SPNL 20GX3.5 QUINCKE YW (NEEDLE) ×1 IMPLANT
NEEDLE SPNL 20GX3.5 QUINCKE YW (NEEDLE) ×2 IMPLANT
NS IRRIG 1000ML POUR BTL (IV SOLUTION) ×2 IMPLANT
PACK LAMINECTOMY NEURO (CUSTOM PROCEDURE TRAY) ×2 IMPLANT
PLATE ANT CERVICAL 52.5 (Plate) ×1 IMPLANT
RUBBERBAND STERILE (MISCELLANEOUS) ×4 IMPLANT
SCREW ST 12X4XST FXANG NS (Screw) IMPLANT
SCREW ST FIX 4 ATL (Screw) ×16 IMPLANT
SPACER BONE CORNERSTONE 6X14 (Orthopedic Implant) ×2 IMPLANT
SPACER BONE CORNERSTONE 7X14 (Orthopedic Implant) ×1 IMPLANT
SPONGE GAUZE 4X4 12PLY (GAUZE/BANDAGES/DRESSINGS) ×2 IMPLANT
SPONGE INTESTINAL PEANUT (DISPOSABLE) ×2 IMPLANT
SPONGE SURGIFOAM ABS GEL 100 (HEMOSTASIS) ×2 IMPLANT
STRIP CLOSURE SKIN 1/2X4 (GAUZE/BANDAGES/DRESSINGS) ×2 IMPLANT
SUT VIC AB 3-0 SH 8-18 (SUTURE) ×3 IMPLANT
SUT VICRYL 4-0 PS2 18IN ABS (SUTURE) ×2 IMPLANT
SYR 20ML ECCENTRIC (SYRINGE) ×2 IMPLANT
TAPE CLOTH 4X10 WHT NS (GAUZE/BANDAGES/DRESSINGS) ×1 IMPLANT
TOWEL OR 17X24 6PK STRL BLUE (TOWEL DISPOSABLE) ×2 IMPLANT
TOWEL OR 17X26 10 PK STRL BLUE (TOWEL DISPOSABLE) ×2 IMPLANT
TRAP SPECIMEN MUCOUS 40CC (MISCELLANEOUS) ×2 IMPLANT
WATER STERILE IRR 1000ML POUR (IV SOLUTION) ×2 IMPLANT

## 2012-03-13 NOTE — Anesthesia Procedure Notes (Signed)
Procedure Name: Intubation Date/Time: 03/13/2012 11:51 AM Performed by: Carmela Rima Pre-anesthesia Checklist: Emergency Drugs available, Patient identified, Timeout performed, Suction available and Patient being monitored Patient Re-evaluated:Patient Re-evaluated prior to inductionOxygen Delivery Method: Circle system utilized Preoxygenation: Pre-oxygenation with 100% oxygen Intubation Type: IV induction Ventilation: Mask ventilation without difficulty Laryngoscope Size: Mac and 4 Grade View: Grade II Tube type: Oral Number of attempts: 1 Placement Confirmation: ETT inserted through vocal cords under direct vision,  positive ETCO2 and breath sounds checked- equal and bilateral Secured at: 21 cm Tube secured with: Tape Dental Injury: Teeth and Oropharynx as per pre-operative assessment

## 2012-03-13 NOTE — Anesthesia Preprocedure Evaluation (Signed)
Anesthesia Evaluation  Patient identified by MRN, date of birth, ID band Patient awake    Reviewed: Allergy & Precautions, H&P , NPO status , Patient's Chart, lab work & pertinent test results  History of Anesthesia Complications (+) PONV  Airway Mallampati: II  Neck ROM: full    Dental   Pulmonary Current Smoker,          Cardiovascular hypertension,     Neuro/Psych  Neuromuscular disease    GI/Hepatic GERD-  ,  Endo/Other    Renal/GU      Musculoskeletal  (+) Arthritis -, Fibromyalgia -  Abdominal   Peds  Hematology   Anesthesia Other Findings   Reproductive/Obstetrics                           Anesthesia Physical Anesthesia Plan  ASA: II  Anesthesia Plan: General   Post-op Pain Management:    Induction: Intravenous  Airway Management Planned: Oral ETT  Additional Equipment:   Intra-op Plan:   Post-operative Plan: Extubation in OR  Informed Consent: I have reviewed the patients History and Physical, chart, labs and discussed the procedure including the risks, benefits and alternatives for the proposed anesthesia with the patient or authorized representative who has indicated his/her understanding and acceptance.     Plan Discussed with: CRNA and Surgeon  Anesthesia Plan Comments:         Anesthesia Quick Evaluation

## 2012-03-13 NOTE — Anesthesia Postprocedure Evaluation (Signed)
Anesthesia Post Note  Patient: Rachel Allison  Procedure(s) Performed: Procedure(s) (LRB): ANTERIOR CERVICAL DECOMPRESSION/DISCECTOMY FUSION 3 LEVELS (N/A)  Anesthesia type: General  Patient location: PACU  Post pain: Pain level controlled and Adequate analgesia  Post assessment: Post-op Vital signs reviewed, Patient's Cardiovascular Status Stable, Respiratory Function Stable, Patent Airway and Pain level controlled  Last Vitals:  Filed Vitals:   03/13/12 1442  BP:   Pulse:   Temp: 37 C  Resp:     Post vital signs: Reviewed and stable  Level of consciousness: awake, alert  and oriented  Complications: No apparent anesthesia complications

## 2012-03-13 NOTE — Plan of Care (Signed)
Problem: Consults Goal: Diagnosis - Spinal Surgery Outcome: Completed/Met Date Met:  03/13/12 Cervical Spine Fusion

## 2012-03-13 NOTE — H&P (Signed)
Rachel Allison is an 56 y.o. female.   Chief Complaint: Neck and bilateral arm pain HPI: Patient is a 56 year female underwent an L2-S1 fusion several months ago and did very well however lately suppress worsening neck and bilateral arm pain with numbness tingling her hands weakness in her hands revealed severe stenosis and spondylosis spinal cord compression at 3 levels including C4-5 C5-6 and C6-7 today's date surgery progressive in of clinical syndrome and imaging findings patient recommend been recommended an ACDF at C4-5 C5-6 and C6-7 risks benefits of the operation 12 perioperative course expectations of outcome alternatives surgery all explained patient agrees to proceed forward.  Past Medical History  Diagnosis Date  . PONV (postoperative nausea and vomiting)   . Hypertension   . GERD (gastroesophageal reflux disease)   . Arthritis   . Fibromyalgia     Past Surgical History  Procedure Date  . Abdominal hysterectomy 27Y RS  . Cholecystectomy 53YRS  . Toe arthroscopy     ROD 11  . Anterior lat lumbar fusion 09/29/2011    Procedure: ANTERIOR LATERAL LUMBAR FUSION 2 LEVELS;  Surgeon: Rachel Dollar, MD;  Location: [facility];  Service: Neurosurgery;  Laterality: N/A;  Lumbar two- three, three-four,  Anterior Lateral Interbody Fusion    History reviewed. No pertinent family history. Social History:  reports that she has been smoking Cigarettes.  She has a 22.5 pack-year smoking history. She does not have any smokeless tobacco history on file. She reports that she does not drink alcohol or use illicit drugs.  Allergies: No Known Allergies  Medications Prior to Admission  Medication Sig Dispense Refill  . diltiazem (DILACOR XR) 180 MG 24 hr capsule Take 180 mg by mouth daily.      . DULoxetine (CYMBALTA) 60 MG capsule Take 60 mg by mouth 2 (two) times daily.      Marland Kitchen esomeprazole (NEXIUM) 40 MG capsule Take 40 mg by mouth daily before breakfast.      . olmesartan (BENICAR) 40 MG  tablet Take 40 mg by mouth daily.      Marland Kitchen zolpidem (AMBIEN) 10 MG tablet Take 10 mg by mouth at bedtime.        No results found for this or any previous visit (from the past 48 hour(s)). No results found.  Review of Systems  Constitutional: Negative.   HENT: Positive for neck pain.   Eyes: Negative.   Respiratory: Negative.   Gastrointestinal: Negative.   Genitourinary: Negative.   Musculoskeletal: Positive for myalgias.  Skin: Negative.   Neurological: Positive for tremors and sensory change.  Endo/Heme/Allergies: Negative.   Psychiatric/Behavioral: Negative.     Blood pressure 125/80, pulse 83, temperature 97.9 F (36.6 C), temperature source Oral, resp. rate 20, SpO2 96.00%. Physical Exam  Constitutional: She is oriented to person, place, and time. She appears well-developed and well-nourished.  Eyes: Pupils are equal, round, and reactive to light.  Neck: Normal range of motion.  Cardiovascular: Normal rate.   Respiratory: Effort normal and breath sounds normal.  GI: Soft. Bowel sounds are normal.  Neurological: She is alert and oriented to person, place, and time. GCS eye subscore is 4. GCS verbal subscore is 5. GCS motor subscore is 6.  Reflex Scores:      Patellar reflexes are 0 on the right side and 0 on the left side.      Achilles reflexes are 0 on the right side and 0 on the left side.  Patient is diffuse weakness at 4+ out of 5 in both upper extremities but his knee pain limited slightly in the triceps bilaterally strength is otherwise 5 out of 5 in her lower 70s her reflexes are normal and symmetric     Assessment/Plan 56 year old female presents for an ACDF at C4-5 C5-6 and C6-7 the  Rachel Allison P 03/13/2012, 11:05 AM

## 2012-03-13 NOTE — Addendum Note (Signed)
Addendum  created 03/13/12 1631 by Turner Daniels, CRNA   Modules edited:Charges VN

## 2012-03-13 NOTE — Transfer of Care (Signed)
Immediate Anesthesia Transfer of Care Note  Patient: Rachel Allison  Procedure(s) Performed: Procedure(s) (LRB): ANTERIOR CERVICAL DECOMPRESSION/DISCECTOMY FUSION 3 LEVELS (N/A)  Patient Location: PACU  Anesthesia Type: General  Level of Consciousness: awake, alert  and oriented  Airway & Oxygen Therapy: Patient Spontanous Breathing and Patient connected to nasal cannula oxygen  Post-op Assessment: Report given to PACU RN, Post -op Vital signs reviewed and stable and Patient moving all extremities  Post vital signs: Reviewed and stable  Complications: No apparent anesthesia complications

## 2012-03-13 NOTE — Preoperative (Signed)
Beta Blockers   Reason not to administer Beta Blockers:Bblocker taken 03/13/12

## 2012-03-13 NOTE — Op Note (Signed)
6 cervical spondylosis with stenosis at C4-5 C5-6 and C6-7 with left greater right cervical radiculopathies at the C5, C6, and C7 nerve roots  Postoperative diagnosis: Same  Procedure: Anterior cervical discectomies and fusion at C4-5, C5-6, and C6-7 using allograft and the Atlantis translational plating system with 8 fixed angle 13 mm screws  Surgeon: Donalee Citrin  Assistant: Sherilyn Cooter pool  Anesthesia: Gen.  EBL: Minimal  History of present illness: Patient is a very pleasant 56 year old female is a progress worsening neck and bilateral arm pain rating to her left shoulder down to her left hand into both the thumb and first 2 fingers of her left hand. Patient noticed increased weakness in the left tricep as well as left hand patient failed all forms of conservative treatment workup reveals severe cervical spondylosis with stenosis at C4-5 C5-6 and C6-7 with spinal cord compression at all these levels an acute disc herniation C6 due to patient's failure conservative treatment progression of clinical syndrome and imaging findings she was recommended anterior cervical discectomies and fusion at C4-5, C5-6, and C6-7. I extensively reviewed the risks and benefits of the operation as well as perioperative course and expectations of outcome and alternatives of surgery she understood and agreed to proceed forward.  Operative procedure: Patient was brought into the or was induced under general anesthesia positioned supine the neck in slight extension in 5 pounds of halter traction the right side of her neck and was prepped and draped in routine sterile fashion. Preoperative x-ray localize the appropriate level so a curvilinear incision was made just off midline to the interbody of the sternocleidomastoid and the superficial layer of the platysma was dissected out and divided longitudinally. The avascular plane to sternomastoid and strap as was developed and the prevertebral fascia and prevertebral fascia was  dissected dissect away with Kitners. Interoperative X. identify initially the C3-4 disc space attention was taken one disc space below this and this was marked with a 15 blade scalpel and then the 2 disc space below that at C5-6 C7 were further marked the longus Cole as reflected laterally and self-retaining retractor was placed. All the spaces were incised large anterior aspect of did not Leksell rongeur and a 3 minute Kerrison punch all 3 disc spaces were drilled and the pain in the posterior annulus facet complexes first working at C6-7 aggressive under biting of both endplates was carried out the PLL was identified and removed in piecemeal fashion there was a large posterior spur coming off the C6 vertebral body this causes significant spinal cord compression on the left side of midline this was aggressively under bitten there was an acute disc herniation out the left C7 foramen this was teased within her foot both C7 nerve roots with development identified and skeletonized flush with pedicle and after adequate under biting of both endplates at C6 and C7 the disc space was noted be widely decompressed centrally as well as foramina. This and packed with Gelfoam to second C4-5 in a similar fashion C4-5 was aggressively drilled down aggressive abutting both endplates carried out there is a large posterior spur it was removed in piecemeal fashion PLL was also removed in piecemeal fashion. The central canal was decompressed the C5 pedicles were identified to C5 nerve roots were skeletonized flush with pedicle marked uncinate hypertrophy predominantly on the right is is all removed decompress the right as well as left C5 nerve root. After adequate decompression achieved centrally and foraminal here this is packed with Gelfoam and a second  C5-6 and a similar fashion C5-6 was aggressively drilled down there was a large posterior spur coming off the C5 vertebral body probably centrally this was under been decompress the  central canal March across laterally both C6 pedicles were identified both C6 nerve roots, social pedicle and to initiate versus is totally are all endplates were scraped BA curette copiously irrigated and meticulous in a stasis was maintained with Gelfoam and Surgifoam 6 mm graft inserted C5-6 and C6-7 and a 7 mm at C6 millimeter graft at C4-5 and C5-6 and a 7 mm C6-7 after all aggressively placed at a 52 mm Atlantis his legs the plate was placed all screws excellent purchase locking mechanisms were engaged posterior fluoroscopy confirmed good position of plate screws and implants the wounds and copious irrigated meticulous hemostasis was maintained a J-P drain was placed and the wounds closed in layers with after Vicryl and the platysma and a running 4 septic or benzoin Steri-Strips applied patient recovered in stable condition at the end of case on it counts sponge counts were correct per the nurses.

## 2012-03-14 ENCOUNTER — Encounter (HOSPITAL_COMMUNITY): Payer: Self-pay | Admitting: Neurosurgery

## 2012-03-14 MED ORDER — OXYCODONE-ACETAMINOPHEN 5-325 MG PO TABS: 1.0000 | ORAL_TABLET | ORAL | Status: AC | PRN

## 2012-03-14 MED ORDER — CYCLOBENZAPRINE HCL 10 MG PO TABS: 10.0000 mg | ORAL_TABLET | Freq: Three times a day (TID) | ORAL | Status: AC | PRN

## 2012-03-14 MED FILL — Diphenhydramine HCl Inj 50 MG/ML: INTRAMUSCULAR | Qty: 1 | Status: AC

## 2012-03-14 NOTE — Progress Notes (Signed)
Subjective: Patient reports Patient well with no arm pain or and pain shortness her neck and difficult swallowing  Objective: Vital signs in last 24 hours: Temp:  [97.8 F (36.6 C)-98.6 F (37 C)] 97.8 F (36.6 C) (07/16 0400) Pulse Rate:  [71-99] 71  (07/16 0400) Resp:  [11-20] 18  (07/16 0400) BP: (101-134)/(61-88) 101/67 mmHg (07/16 0400) SpO2:  [92 %-96 %] 94 % (07/16 0400)  Intake/Output from previous day: 07/15 0701 - 07/16 0700 In: 2120 [P.O.:720; I.V.:900; IV Piggyback:500] Out: 115 [Drains:65; Blood:50] Intake/Output this shift:    Strength is 5 out of 5 wound is clean and dry  Lab Results: No results found for this basename: WBC:2,HGB:2,HCT:2,PLT:2 in the last 72 hours BMET No results found for this basename: NA:2,K:2,CL:2,CO2:2,GLUCOSE:2,BUN:2,CREATININE:2,CALCIUM:2 in the last 72 hours  Studies/Results: Dg Cervical Spine 2-3 Views  03/13/2012  *RADIOLOGY REPORT*  Clinical Data: Cervical disc disease.  CERVICAL SPINE - 2-3 VIEW  Comparison: None.  Findings: Lateral C-arm images demonstrate that the patient has undergone anterior cervical fusion at C4-5, C5-6, and C6-7.  Plate and screws appear in good position in the lateral projection with anatomic alignment of the vertebra.  IMPRESSION: Anterior cervical fusions performed from C4-C7.  Original Report Authenticated By: Gwynn Burly, M.D.   Dg C-arm 1-60 Min  03/13/2012   Original Report Authenticated By: Gwynn Burly, M.D.    Assessment/Plan: Discharge  LOS: 1 day     Pratyush Ammon P 03/14/2012, 7:17 AM

## 2012-03-14 NOTE — Discharge Summary (Signed)
  Physician Discharge Summary  Patient ID: Rachel Allison MRN: [id number] DOB/AGE: 1955/12/15 56 y.o.  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 *    Discharged Condition: good  Hospital Course: Patient was admitted hospital underwent an ACDF at C4-5, C5-6, and C6-7 postop patient did very well in the floor on the floor patient was angling and voiding spontaneously tolerating a regular diet significant proven of arm and hand pain neck was sore patient is discharged home scheduled followup in one to 2 weeks.  Consults: Significant Diagnostic Studies: Treatments: ACDF C4-5, C5-6, C6-7 Discharge Exam: Blood pressure 101/67, pulse 71, temperature 97.8 F (36.6 C), temperature source Oral, resp. rate 18, SpO2 94.00%. Strength out of 5 wound clean dry  Disposition: Home   Medication List  As of 03/14/2012  7:22 AM   TAKE these medications         cyclobenzaprine 10 MG tablet   Commonly known as: FLEXERIL   Take 1 tablet (10 mg total) by mouth 3 (three) times daily as needed for muscle spasms.      diltiazem 180 MG 24 hr capsule   Commonly known as: DILACOR XR   Take 180 mg by mouth daily.      DULoxetine 60 MG capsule   Commonly known as: CYMBALTA   Take 60 mg by mouth 2 (two) times daily.      esomeprazole 40 MG capsule   Commonly known as: NEXIUM   Take 40 mg by mouth daily before breakfast.      olmesartan 40 MG tablet   Commonly known as: BENICAR   Take 40 mg by mouth daily.      oxyCODONE-acetaminophen 5-325 MG per tablet   Commonly known as: PERCOCET/ROXICET   Take 1-2 tablets by mouth every 4 (four) hours as needed.      zolpidem 10 MG tablet   Commonly known as: AMBIEN   Take 10 mg by mouth at bedtime.             Signed: Zamyah Wiesman P 03/14/2012, 7:22 AM

## 2012-05-18 ENCOUNTER — Other Ambulatory Visit: Payer: Self-pay | Admitting: Neurosurgery

## 2012-05-18 DIAGNOSIS — M542 Cervicalgia: Secondary | ICD-10-CM

## 2012-05-24 ENCOUNTER — Ambulatory Visit
Admission: RE | Admit: 2012-05-24 | Discharge: 2012-05-24 | Disposition: A | Discharge status: Home / Self Care | Payer: Managed Care, Other (non HMO) | Source: Ambulatory Visit | Attending: Neurosurgery | Admitting: Neurosurgery

## 2012-05-24 DIAGNOSIS — M542 Cervicalgia: Secondary | ICD-10-CM

## 2012-06-21 ENCOUNTER — Other Ambulatory Visit: Payer: Self-pay | Admitting: Neurosurgery

## 2012-06-21 DIAGNOSIS — M545 Low back pain: Secondary | ICD-10-CM

## 2012-06-28 ENCOUNTER — Ambulatory Visit
Admission: RE | Admit: 2012-06-28 | Discharge: 2012-06-28 | Disposition: A | Discharge status: Home / Self Care | Payer: Managed Care, Other (non HMO) | Source: Ambulatory Visit | Attending: Neurosurgery | Admitting: Neurosurgery

## 2012-06-28 DIAGNOSIS — M545 Low back pain: Secondary | ICD-10-CM

## 2012-06-28 MED ORDER — GADOBENATE DIMEGLUMINE 529 MG/ML IV SOLN
17.0000 mL | Freq: Once | INTRAVENOUS | Status: AC | PRN
  Administered 2012-06-28: 17 mL via INTRAVENOUS

## 2013-01-30 ENCOUNTER — Other Ambulatory Visit: Payer: Self-pay | Admitting: Neurosurgery

## 2013-01-30 DIAGNOSIS — M5412 Radiculopathy, cervical region: Secondary | ICD-10-CM

## 2013-02-08 ENCOUNTER — Ambulatory Visit
Admission: RE | Admit: 2013-02-08 | Discharge: 2013-02-08 | Disposition: A | Discharge status: Home / Self Care | Payer: 59 | Source: Ambulatory Visit | Attending: Neurosurgery | Admitting: Neurosurgery

## 2013-02-08 ENCOUNTER — Other Ambulatory Visit: Payer: Self-pay | Admitting: Neurosurgery

## 2013-02-08 DIAGNOSIS — M5412 Radiculopathy, cervical region: Secondary | ICD-10-CM

## 2013-08-09 ENCOUNTER — Encounter (HOSPITAL_COMMUNITY): Payer: Self-pay

## 2013-08-10 ENCOUNTER — Other Ambulatory Visit: Payer: Self-pay | Admitting: Neurosurgery

## 2013-08-15 ENCOUNTER — Encounter (HOSPITAL_COMMUNITY)
Admission: RE | Admit: 2013-08-15 | Discharge: 2013-08-15 | Disposition: A | Discharge status: Home / Self Care | Payer: Medicare Other | Source: Ambulatory Visit | Attending: Neurosurgery | Admitting: Neurosurgery

## 2013-08-15 ENCOUNTER — Encounter (HOSPITAL_COMMUNITY): Payer: Self-pay

## 2013-08-15 ENCOUNTER — Encounter (HOSPITAL_COMMUNITY)
Admission: RE | Admit: 2013-08-15 | Discharge: 2013-08-15 | Disposition: A | Discharge status: Home / Self Care | Payer: Medicare Other | Source: Ambulatory Visit | Attending: Anesthesiology | Admitting: Anesthesiology

## 2013-08-15 DIAGNOSIS — Z01818 Encounter for other preprocedural examination: Secondary | ICD-10-CM | POA: Insufficient documentation

## 2013-08-15 DIAGNOSIS — Z01812 Encounter for preprocedural laboratory examination: Secondary | ICD-10-CM | POA: Insufficient documentation

## 2013-08-15 DIAGNOSIS — Z01810 Encounter for preprocedural cardiovascular examination: Secondary | ICD-10-CM | POA: Insufficient documentation

## 2013-08-15 HISTORY — DX: Cardiac arrhythmia, unspecified: I49.9

## 2013-08-15 LAB — TYPE AND SCREEN: ABO/RH(D): O POS

## 2013-08-15 LAB — CBC
MCH: 30.6 pg (ref 26.0–34.0)
MCHC: 34.1 g/dL (ref 30.0–36.0)
MCV: 89.7 fL (ref 78.0–100.0)
Platelets: 315 10*3/uL (ref 150–400)
RDW: 13.4 % (ref 11.5–15.5)

## 2013-08-15 LAB — BASIC METABOLIC PANEL
CO2: 24 mEq/L (ref 19–32)
Calcium: 9.4 mg/dL (ref 8.4–10.5)
Creatinine, Ser: 0.77 mg/dL (ref 0.50–1.10)
GFR calc Af Amer: >90 mL/min (ref >90)
GFR calc non Af Amer: >90 mL/min (ref >90)

## 2013-08-15 LAB — SURGICAL PCR SCREEN: Staphylococcus aureus: NEGATIVE

## 2013-08-15 NOTE — Pre-Procedure Instructions (Signed)
Rachel Allison  08/15/2013   Your procedure is scheduled on:  Friday, August 17, 2013 @ 7:30 AM  Report to [facility] Short Stay (use Main Entrance "A'') at 5:30 AM.  Call this number if you have problems the morning of surgery: [phone]   Remember:   Do not eat food or drink liquids after midnight.   Take these medicines the morning of surgery with A SIP OF WATER: esomeprazole (NEXIUM) 40 MG capsule, DULoxetine (CYMBALTA) 60 MG capsule, diltiazem (DILACOR XR) 180 MG 24 hr capsule, if needed:  acetaminophen (TYLENOL) 325 MG tablet for moderate pain Stop taking Aspirin, vitamins and herbal medications. Do not take any NSAIDs ie: Ibuprofen, Advil, Naproxen or any medication containing Aspirin.  Do not wear jewelry, make-up or nail polish.  Do not wear lotions, powders, or perfumes. You may wear deodorant.  Do not shave 48 hours prior to surgery  Do not bring valuables to the hospital.  [facility] is not responsible  for any belongings or valuables.               Contacts, dentures or bridgework may not be worn into surgery.  Leave suitcase in the car. After surgery it may be brought to your room.  For patients admitted to the hospital, discharge time is determined by your treatment team.               Patients discharged the day of surgery will not be allowed to drive home.  Name and phone number of your driver:   Special Instructions: Shower using CHG 2 nights before surgery and the night before surgery.  If you shower the day of surgery use CHG.  Use special wash - you have one bottle of CHG for all showers.  You should use approximately 1/3 of the bottle for each shower.   Please read over the following fact sheets that you were given: Pain Booklet, Coughing and Deep Breathing, Blood Transfusion Information, MRSA Information and Surgical Site Infection Prevention

## 2013-08-16 NOTE — Progress Notes (Signed)
Pt notified of surgery time change. Pt instructed to be here at 8:15 am tomorrow (08/17/13).

## 2013-08-17 ENCOUNTER — Inpatient Hospital Stay (HOSPITAL_COMMUNITY): Payer: Medicare Other | Admitting: Certified Registered Nurse Anesthetist

## 2013-08-17 ENCOUNTER — Encounter (HOSPITAL_COMMUNITY): Payer: Self-pay | Admitting: *Deleted

## 2013-08-17 ENCOUNTER — Inpatient Hospital Stay (HOSPITAL_COMMUNITY)
Admission: RE | Admit: 2013-08-17 | Discharge: 2013-08-21 | DRG: 473 | Disposition: A | Discharge status: Home / Self Care | Payer: Medicare Other | Source: Ambulatory Visit | Attending: Neurosurgery | Admitting: Neurosurgery

## 2013-08-17 ENCOUNTER — Encounter (HOSPITAL_COMMUNITY)
Admission: RE | Disposition: A | Discharge status: Home / Self Care | Payer: Self-pay | Source: Ambulatory Visit | Attending: Neurosurgery

## 2013-08-17 ENCOUNTER — Inpatient Hospital Stay (HOSPITAL_COMMUNITY): Payer: Medicare Other

## 2013-08-17 ENCOUNTER — Encounter (HOSPITAL_COMMUNITY): Payer: Medicare Other | Admitting: Certified Registered Nurse Anesthetist

## 2013-08-17 DIAGNOSIS — M47817 Spondylosis without myelopathy or radiculopathy, lumbosacral region: Secondary | ICD-10-CM | POA: Diagnosis present

## 2013-08-17 DIAGNOSIS — R5381 Other malaise: Secondary | ICD-10-CM | POA: Diagnosis present

## 2013-08-17 DIAGNOSIS — F17200 Nicotine dependence, unspecified, uncomplicated: Secondary | ICD-10-CM | POA: Diagnosis present

## 2013-08-17 DIAGNOSIS — Z981 Arthrodesis status: Secondary | ICD-10-CM

## 2013-08-17 DIAGNOSIS — IMO0001 Reserved for inherently not codable concepts without codable children: Secondary | ICD-10-CM | POA: Diagnosis present

## 2013-08-17 DIAGNOSIS — K219 Gastro-esophageal reflux disease without esophagitis: Secondary | ICD-10-CM | POA: Diagnosis present

## 2013-08-17 DIAGNOSIS — R209 Unspecified disturbances of skin sensation: Secondary | ICD-10-CM | POA: Diagnosis present

## 2013-08-17 DIAGNOSIS — M79609 Pain in unspecified limb: Secondary | ICD-10-CM | POA: Diagnosis present

## 2013-08-17 DIAGNOSIS — M47812 Spondylosis without myelopathy or radiculopathy, cervical region: Secondary | ICD-10-CM | POA: Diagnosis present

## 2013-08-17 DIAGNOSIS — S32009K Unspecified fracture of unspecified lumbar vertebra, subsequent encounter for fracture with nonunion: Secondary | ICD-10-CM

## 2013-08-17 HISTORY — PX: POSTERIOR CERVICAL FUSION/FORAMINOTOMY: SHX5038

## 2013-08-17 HISTORY — PX: LAMINECTOMY WITH POSTERIOR LATERAL ARTHRODESIS LEVEL 1: SHX6335

## 2013-08-17 SURGERY — LAMINECTOMY WITH POSTERIOR LATERAL ARTHRODESIS LEVEL 1
Anesthesia: General | Site: Neck

## 2013-08-17 MED ORDER — FAMOTIDINE 10 MG PO TABS
10.0000 mg | ORAL_TABLET | Freq: Two times a day (BID) | ORAL | Status: DC
  Administered 2013-08-17 – 2013-08-21 (×8): 10 mg via ORAL
  Filled 2013-08-17 (×9): qty 1

## 2013-08-17 MED ORDER — PHENOL 1.4 % MT LIQD
1.0000 | OROMUCOSAL | Status: DC | PRN
  Filled 2013-08-17: qty 177

## 2013-08-17 MED ORDER — OXYCODONE HCL 5 MG/5ML PO SOLN: 5.0000 mg | Freq: Once | ORAL | Status: AC | PRN

## 2013-08-17 MED ORDER — SODIUM CHLORIDE 0.9 % IR SOLN
Status: DC | PRN
  Administered 2013-08-17 (×2)

## 2013-08-17 MED ORDER — LIDOCAINE-EPINEPHRINE 1 %-1:100000 IJ SOLN
INTRAMUSCULAR | Status: DC | PRN
  Administered 2013-08-17: 7 mL
  Administered 2013-08-17: 10 mL

## 2013-08-17 MED ORDER — CEFAZOLIN SODIUM-DEXTROSE 2-3 GM-% IV SOLR
INTRAVENOUS | Status: AC
  Filled 2013-08-17: qty 50

## 2013-08-17 MED ORDER — DULOXETINE HCL 60 MG PO CPEP
60.0000 mg | ORAL_CAPSULE | Freq: Two times a day (BID) | ORAL | Status: DC
  Administered 2013-08-17 – 2013-08-21 (×8): 60 mg via ORAL
  Filled 2013-08-17 (×9): qty 1

## 2013-08-17 MED ORDER — OXYCODONE HCL 5 MG PO TABS
ORAL_TABLET | ORAL | Status: AC
  Administered 2013-08-17: 5 mg via ORAL
  Filled 2013-08-17: qty 1

## 2013-08-17 MED ORDER — ONDANSETRON HCL 4 MG/2ML IJ SOLN: 4.0000 mg | Freq: Four times a day (QID) | INTRAMUSCULAR | Status: DC | PRN

## 2013-08-17 MED ORDER — DILTIAZEM HCL ER 180 MG PO CP24
180.0000 mg | ORAL_CAPSULE | Freq: Every day | ORAL | Status: DC
  Administered 2013-08-18 – 2013-08-21 (×4): 180 mg via ORAL
  Filled 2013-08-17 (×4): qty 1

## 2013-08-17 MED ORDER — MANAGING BACK PAIN BOOK
Freq: Once | Status: AC
  Administered 2013-08-17: 23:00:00
  Filled 2013-08-17: qty 1

## 2013-08-17 MED ORDER — MENTHOL 3 MG MT LOZG
1.0000 | LOZENGE | OROMUCOSAL | Status: DC | PRN
  Filled 2013-08-17: qty 9

## 2013-08-17 MED ORDER — CYCLOBENZAPRINE HCL 10 MG PO TABS
10.0000 mg | ORAL_TABLET | Freq: Three times a day (TID) | ORAL | Status: DC | PRN
  Administered 2013-08-18 – 2013-08-20 (×4): 10 mg via ORAL
  Filled 2013-08-17 (×4): qty 1

## 2013-08-17 MED ORDER — BACITRACIN ZINC 500 UNIT/GM EX OINT
TOPICAL_OINTMENT | CUTANEOUS | Status: DC | PRN
  Administered 2013-08-17: 1 via TOPICAL

## 2013-08-17 MED ORDER — GLYCOPYRROLATE 0.2 MG/ML IJ SOLN
INTRAMUSCULAR | Status: DC | PRN
  Administered 2013-08-17: .8 mg via INTRAVENOUS

## 2013-08-17 MED ORDER — HYDROMORPHONE HCL PF 1 MG/ML IJ SOLN
0.2500 mg | INTRAMUSCULAR | Status: DC | PRN
  Administered 2013-08-17: 0.25 mg via INTRAVENOUS
  Administered 2013-08-17: 0.5 mg via INTRAVENOUS

## 2013-08-17 MED ORDER — ONDANSETRON HCL 4 MG/2ML IJ SOLN
INTRAMUSCULAR | Status: DC | PRN
  Administered 2013-08-17: 4 mg via INTRAVENOUS

## 2013-08-17 MED ORDER — ZOLPIDEM TARTRATE 10 MG PO TABS: 10.0000 mg | ORAL_TABLET | Freq: Every day | ORAL | Status: DC

## 2013-08-17 MED ORDER — ONDANSETRON HCL 4 MG/2ML IJ SOLN
4.0000 mg | INTRAMUSCULAR | Status: DC | PRN
  Administered 2013-08-18: 4 mg via INTRAVENOUS
  Filled 2013-08-17: qty 2

## 2013-08-17 MED ORDER — CEFAZOLIN SODIUM 1-5 GM-% IV SOLN
1.0000 g | Freq: Three times a day (TID) | INTRAVENOUS | Status: AC
  Administered 2013-08-17 – 2013-08-18 (×2): 1 g via INTRAVENOUS
  Filled 2013-08-17 (×3): qty 50

## 2013-08-17 MED ORDER — ACETAMINOPHEN 650 MG RE SUPP: 650.0000 mg | RECTAL | Status: DC | PRN

## 2013-08-17 MED ORDER — PROPOFOL 10 MG/ML IV BOLUS
INTRAVENOUS | Status: DC | PRN
  Administered 2013-08-17: 160 mg via INTRAVENOUS

## 2013-08-17 MED ORDER — DOCUSATE SODIUM 100 MG PO CAPS
100.0000 mg | ORAL_CAPSULE | Freq: Two times a day (BID) | ORAL | Status: DC
  Administered 2013-08-17 – 2013-08-21 (×8): 100 mg via ORAL
  Filled 2013-08-17 (×8): qty 1

## 2013-08-17 MED ORDER — ALBUMIN HUMAN 5 % IV SOLN
INTRAVENOUS | Status: DC | PRN
  Administered 2013-08-17 (×2): via INTRAVENOUS

## 2013-08-17 MED ORDER — PHENYLEPHRINE HCL 10 MG/ML IJ SOLN
INTRAMUSCULAR | Status: DC | PRN
  Administered 2013-08-17 (×3): 80 ug via INTRAVENOUS
  Administered 2013-08-17: 40 ug via INTRAVENOUS
  Administered 2013-08-17 (×2): 100 ug via INTRAVENOUS
  Administered 2013-08-17: 80 ug via INTRAVENOUS
  Administered 2013-08-17 (×2): 100 ug via INTRAVENOUS

## 2013-08-17 MED ORDER — CEFAZOLIN SODIUM-DEXTROSE 2-3 GM-% IV SOLR
INTRAVENOUS | Status: AC
  Administered 2013-08-17 (×2): 2 g via INTRAVENOUS
  Filled 2013-08-17: qty 50

## 2013-08-17 MED ORDER — OXYCODONE HCL 5 MG PO TABS: 5.0000 mg | ORAL_TABLET | Freq: Once | ORAL | Status: AC | PRN

## 2013-08-17 MED ORDER — EPHEDRINE SULFATE 50 MG/ML IJ SOLN
INTRAMUSCULAR | Status: DC | PRN
  Administered 2013-08-17: 10 mg via INTRAVENOUS
  Administered 2013-08-17: 20 mg via INTRAVENOUS
  Administered 2013-08-17 (×2): 10 mg via INTRAVENOUS

## 2013-08-17 MED ORDER — THROMBIN 20000 UNITS EX SOLR
CUTANEOUS | Status: DC | PRN
  Administered 2013-08-17: 13:00:00 via TOPICAL

## 2013-08-17 MED ORDER — ALUM & MAG HYDROXIDE-SIMETH 200-200-20 MG/5ML PO SUSP: 30.0000 mL | Freq: Four times a day (QID) | ORAL | Status: DC | PRN

## 2013-08-17 MED ORDER — ARTIFICIAL TEARS OP OINT
TOPICAL_OINTMENT | OPHTHALMIC | Status: DC | PRN
  Administered 2013-08-17: 1 via OPHTHALMIC

## 2013-08-17 MED ORDER — FENTANYL CITRATE 0.05 MG/ML IJ SOLN
INTRAMUSCULAR | Status: DC | PRN
  Administered 2013-08-17: 100 ug via INTRAVENOUS

## 2013-08-17 MED ORDER — VITAMIN D3 25 MCG (1000 UNIT) PO TABS
2000.0000 [IU] | ORAL_TABLET | Freq: Every day | ORAL | Status: DC
  Administered 2013-08-18 – 2013-08-21 (×4): 2000 [IU] via ORAL
  Filled 2013-08-17 (×4): qty 2

## 2013-08-17 MED ORDER — SODIUM CHLORIDE 0.9 % IJ SOLN
3.0000 mL | Freq: Two times a day (BID) | INTRAMUSCULAR | Status: DC
  Administered 2013-08-18 – 2013-08-21 (×7): 3 mL via INTRAVENOUS

## 2013-08-17 MED ORDER — IRBESARTAN 75 MG PO TABS
75.0000 mg | ORAL_TABLET | Freq: Every day | ORAL | Status: DC
  Administered 2013-08-18 – 2013-08-21 (×4): 75 mg via ORAL
  Filled 2013-08-17 (×4): qty 1

## 2013-08-17 MED ORDER — MIDAZOLAM HCL 5 MG/5ML IJ SOLN
INTRAMUSCULAR | Status: DC | PRN
  Administered 2013-08-17: 2 mg via INTRAVENOUS

## 2013-08-17 MED ORDER — ZOLPIDEM TARTRATE 5 MG PO TABS
5.0000 mg | ORAL_TABLET | Freq: Every day | ORAL | Status: DC
  Administered 2013-08-20: 5 mg via ORAL
  Filled 2013-08-17: qty 1

## 2013-08-17 MED ORDER — ROCURONIUM BROMIDE 100 MG/10ML IV SOLN
INTRAVENOUS | Status: DC | PRN
  Administered 2013-08-17: 50 mg via INTRAVENOUS
  Administered 2013-08-17: 30 mg via INTRAVENOUS
  Administered 2013-08-17: 20 mg via INTRAVENOUS
  Administered 2013-08-17: 10 mg via INTRAVENOUS
  Administered 2013-08-17: 30 mg via INTRAVENOUS

## 2013-08-17 MED ORDER — LIDOCAINE HCL (CARDIAC) 20 MG/ML IV SOLN
INTRAVENOUS | Status: DC | PRN
  Administered 2013-08-17: 80 mg via INTRAVENOUS

## 2013-08-17 MED ORDER — HYDROMORPHONE HCL PF 1 MG/ML IJ SOLN
1.0000 mg | INTRAMUSCULAR | Status: DC | PRN
  Administered 2013-08-17 – 2013-08-18 (×5): 1 mg via INTRAVENOUS
  Filled 2013-08-17 (×6): qty 1

## 2013-08-17 MED ORDER — HYDROMORPHONE HCL PF 1 MG/ML IJ SOLN
INTRAMUSCULAR | Status: AC
  Administered 2013-08-17: 0.25 mg via INTRAVENOUS
  Filled 2013-08-17: qty 1

## 2013-08-17 MED ORDER — SODIUM CHLORIDE 0.9 % IV SOLN: 250.0000 mL | INTRAVENOUS | Status: DC

## 2013-08-17 MED ORDER — BUPIVACAINE HCL (PF) 0.25 % IJ SOLN
INTRAMUSCULAR | Status: DC | PRN
  Administered 2013-08-17: 7 mL
  Administered 2013-08-17: 9 mL

## 2013-08-17 MED ORDER — NEOSTIGMINE METHYLSULFATE 1 MG/ML IJ SOLN
INTRAMUSCULAR | Status: DC | PRN
  Administered 2013-08-17: 5 mg via INTRAVENOUS

## 2013-08-17 MED ORDER — PHENYLEPHRINE HCL 10 MG/ML IJ SOLN
10.0000 mg | INTRAVENOUS | Status: DC | PRN
  Administered 2013-08-17: 20 ug/min via INTRAVENOUS

## 2013-08-17 MED ORDER — SODIUM CHLORIDE 0.9 % IJ SOLN: 3.0000 mL | INTRAMUSCULAR | Status: DC | PRN

## 2013-08-17 MED ORDER — HEMOSTATIC AGENTS (NO CHARGE) OPTIME
TOPICAL | Status: DC | PRN
  Administered 2013-08-17: 1 via TOPICAL

## 2013-08-17 MED ORDER — HYDROMORPHONE HCL PF 1 MG/ML IJ SOLN
INTRAMUSCULAR | Status: AC
  Filled 2013-08-17: qty 1

## 2013-08-17 MED ORDER — OXYCODONE HCL 5 MG PO TABS
15.0000 mg | ORAL_TABLET | ORAL | Status: DC | PRN
  Administered 2013-08-18: 15 mg via ORAL
  Administered 2013-08-18: 5 mg via ORAL
  Administered 2013-08-21: 15 mg via ORAL
  Filled 2013-08-17 (×5): qty 3

## 2013-08-17 MED ORDER — HYDROMORPHONE HCL PF 1 MG/ML IJ SOLN
INTRAMUSCULAR | Status: DC | PRN
  Administered 2013-08-17 (×2): .2 mg via INTRAVENOUS
  Administered 2013-08-17: .1 mg via INTRAVENOUS
  Administered 2013-08-17 (×2): .2 mg via INTRAVENOUS

## 2013-08-17 MED ORDER — LACTATED RINGERS IV SOLN: INTRAVENOUS | Status: DC | PRN

## 2013-08-17 MED ORDER — PANTOPRAZOLE SODIUM 40 MG PO TBEC
40.0000 mg | DELAYED_RELEASE_TABLET | Freq: Every day | ORAL | Status: DC
  Administered 2013-08-18 – 2013-08-21 (×4): 40 mg via ORAL
  Filled 2013-08-17 (×4): qty 1

## 2013-08-17 MED ORDER — ACETAMINOPHEN 325 MG PO TABS
650.0000 mg | ORAL_TABLET | ORAL | Status: DC | PRN
  Administered 2013-08-17 – 2013-08-18 (×2): 650 mg via ORAL
  Filled 2013-08-17 (×2): qty 2

## 2013-08-17 MED ORDER — LACTATED RINGERS IV SOLN
INTRAVENOUS | Status: DC
  Administered 2013-08-17 (×4): via INTRAVENOUS

## 2013-08-17 SURGICAL SUPPLY — 89 items
ADH SKN CLS APL DERMABOND .7 (GAUZE/BANDAGES/DRESSINGS)
ADH SKN CLS LQ APL DERMABOND (GAUZE/BANDAGES/DRESSINGS) ×4
APL SKNCLS STERI-STRIP NONHPOA (GAUZE/BANDAGES/DRESSINGS) ×6
BAG DECANTER FOR FLEXI CONT (MISCELLANEOUS) ×5 IMPLANT
BENZOIN TINCTURE PRP APPL 2/3 (GAUZE/BANDAGES/DRESSINGS) ×9 IMPLANT
BLADE SURG 11 STRL SS (BLADE) ×6 IMPLANT
BLADE SURG ROTATE 9660 (MISCELLANEOUS) ×3 IMPLANT
BRUSH SCRUB EZ PLAIN DRY (MISCELLANEOUS) ×5 IMPLANT
BUR MATCHSTICK NEURO 3.0 LAGG (BURR) ×6 IMPLANT
BUR PRECISION FLUTE 6.0 (BURR) ×3 IMPLANT
CANISTER SUCT 3000ML (MISCELLANEOUS) ×6 IMPLANT
CAP ELLIPSE LOCKING (Cap) ×4 IMPLANT
CAP LOCKING REVERE (Cap) ×4 IMPLANT
CONT SPEC 4OZ CLIKSEAL STRL BL (MISCELLANEOUS) ×9 IMPLANT
COVER BACK TABLE 24X17X13 BIG (DRAPES) IMPLANT
COVER TABLE BACK 60X90 (DRAPES) ×3 IMPLANT
DECANTER SPIKE VIAL GLASS SM (MISCELLANEOUS) ×4 IMPLANT
DERMABOND ADHESIVE PROPEN (GAUZE/BANDAGES/DRESSINGS) ×2
DERMABOND ADVANCED (GAUZE/BANDAGES/DRESSINGS)
DERMABOND ADVANCED .7 DNX12 (GAUZE/BANDAGES/DRESSINGS) ×4 IMPLANT
DERMABOND ADVANCED .7 DNX6 (GAUZE/BANDAGES/DRESSINGS) IMPLANT
DRAPE C-ARM 42X72 X-RAY (DRAPES) ×8 IMPLANT
DRAPE LAPAROTOMY 100X72 PEDS (DRAPES) ×3 IMPLANT
DRAPE LAPAROTOMY 100X72X124 (DRAPES) ×3 IMPLANT
DRAPE MICROSCOPE ZEISS OPMI (DRAPES) IMPLANT
DRAPE POUCH INSTRU U-SHP 10X18 (DRAPES) ×6 IMPLANT
DRAPE PROXIMA HALF (DRAPES) IMPLANT
DRAPE SURG 17X23 STRL (DRAPES) ×11 IMPLANT
DRSG OPSITE 4X5.5 SM (GAUZE/BANDAGES/DRESSINGS) ×8 IMPLANT
DRSG OPSITE POSTOP 4X6 (GAUZE/BANDAGES/DRESSINGS) ×3 IMPLANT
DRSG OPSITE POSTOP 4X8 (GAUZE/BANDAGES/DRESSINGS) ×1 IMPLANT
DURAPREP 26ML APPLICATOR (WOUND CARE) ×6 IMPLANT
ELECT REM PT RETURN 9FT ADLT (ELECTROSURGICAL) ×3
ELECTRODE REM PT RTRN 9FT ADLT (ELECTROSURGICAL) ×4 IMPLANT
EVACUATOR 3/16  PVC DRAIN (DRAIN) ×2
EVACUATOR 3/16 PVC DRAIN (DRAIN) ×2 IMPLANT
GAUZE SPONGE 4X4 16PLY XRAY LF (GAUZE/BANDAGES/DRESSINGS) ×2 IMPLANT
GLOVE BIO SURGEON STRL SZ8 (GLOVE) ×10 IMPLANT
GLOVE BIOGEL PI IND STRL 7.5 (GLOVE) ×1 IMPLANT
GLOVE BIOGEL PI IND STRL 8 (GLOVE) IMPLANT
GLOVE BIOGEL PI IND STRL 8.5 (GLOVE) IMPLANT
GLOVE BIOGEL PI INDICATOR 7.5 (GLOVE) ×1
GLOVE BIOGEL PI INDICATOR 8 (GLOVE) ×2
GLOVE BIOGEL PI INDICATOR 8.5 (GLOVE) ×2
GLOVE ECLIPSE 7.5 STRL STRAW (GLOVE) ×6 IMPLANT
GLOVE EXAM NITRILE LRG STRL (GLOVE) IMPLANT
GLOVE EXAM NITRILE MD LF STRL (GLOVE) IMPLANT
GLOVE EXAM NITRILE XL STR (GLOVE) IMPLANT
GLOVE EXAM NITRILE XS STR PU (GLOVE) IMPLANT
GLOVE INDICATOR 8.5 STRL (GLOVE) ×9 IMPLANT
GOWN BRE IMP SLV AUR LG STRL (GOWN DISPOSABLE) ×2 IMPLANT
GOWN BRE IMP SLV AUR XL STRL (GOWN DISPOSABLE) ×11 IMPLANT
GOWN STRL REIN 2XL LVL4 (GOWN DISPOSABLE) ×2 IMPLANT
KIT BASIN OR (CUSTOM PROCEDURE TRAY) ×6 IMPLANT
KIT INFUSE X SMALL 1.4CC (Orthopedic Implant) ×1 IMPLANT
KIT ROOM TURNOVER OR (KITS) ×4 IMPLANT
MARKER SKIN DUAL TIP RULER LAB (MISCELLANEOUS) ×3 IMPLANT
NDL HYPO 25X1 1.5 SAFETY (NEEDLE) ×4 IMPLANT
NDL SPNL 20GX3.5 QUINCKE YW (NEEDLE) ×2 IMPLANT
NEEDLE HYPO 25X1 1.5 SAFETY (NEEDLE) ×6 IMPLANT
NEEDLE SPNL 20GX3.5 QUINCKE YW (NEEDLE) ×3 IMPLANT
NS IRRIG 1000ML POUR BTL (IV SOLUTION) ×5 IMPLANT
PACK LAMINECTOMY NEURO (CUSTOM PROCEDURE TRAY) ×6 IMPLANT
PAD ARMBOARD 7.5X6 YLW CONV (MISCELLANEOUS) ×15 IMPLANT
PIN MAYFIELD SKULL DISP (PIN) ×3 IMPLANT
PUTTY BONE DBX 2.5 MIS (Bone Implant) ×1 IMPLANT
PUTTY BONE DBX 5CC MIX (Putty) ×1 IMPLANT
ROD CURVED 5.5MMX35MM (Rod) ×2 IMPLANT
ROD CURVED 5.5MMX65MM (Rod) ×1 IMPLANT
ROD ELLIPSE 40MMX3.5 (Rod) ×4 IMPLANT
RUBBERBAND STERILE (MISCELLANEOUS) IMPLANT
SCREW 3.5X18MM (Screw) ×4 IMPLANT
SCREW ELLIPSE 14X3.5MM (Screw) ×2 IMPLANT
SCREW PEDICLE 8.5MMX45MM (Screw) ×4 IMPLANT
SPONGE GAUZE 4X4 12PLY (GAUZE/BANDAGES/DRESSINGS) ×4 IMPLANT
SPONGE LAP 4X18 X RAY DECT (DISPOSABLE) IMPLANT
SPONGE SURGIFOAM ABS GEL 100 (HEMOSTASIS) ×5 IMPLANT
SPONGE SURGIFOAM ABS GEL SZ50 (HEMOSTASIS) ×1 IMPLANT
STRIP CLOSURE SKIN 1/2X4 (GAUZE/BANDAGES/DRESSINGS) ×9 IMPLANT
SUT ETHILON 4 0 PS 2 18 (SUTURE) IMPLANT
SUT VIC AB 0 CT1 18XCR BRD8 (SUTURE) ×6 IMPLANT
SUT VIC AB 0 CT1 8-18 (SUTURE) ×9
SUT VIC AB 2-0 CT1 18 (SUTURE) ×7 IMPLANT
SUT VICRYL 4-0 PS2 18IN ABS (SUTURE) ×7 IMPLANT
SYR 20ML ECCENTRIC (SYRINGE) ×6 IMPLANT
TOWEL OR 17X24 6PK STRL BLUE (TOWEL DISPOSABLE) ×5 IMPLANT
TOWEL OR 17X26 10 PK STRL BLUE (TOWEL DISPOSABLE) ×6 IMPLANT
TRAY FOLEY CATH 14FRSI W/METER (CATHETERS) ×3 IMPLANT
WATER STERILE IRR 1000ML POUR (IV SOLUTION) ×6 IMPLANT

## 2013-08-17 NOTE — Anesthesia Postprocedure Evaluation (Signed)
Anesthesia Post Note  Patient: Rachel Allison  Procedure(s) Performed: Procedure(s) (LRB): Lumbar five-Sacral one Posterior Lumbar Arthrodesis, Iliac Crest Bone Graft (N/A) Cervical seven-Thoracic one Posterior Cervical Fusion with lateral mass fixation (N/A)  Anesthesia type: General  Patient location: PACU  Post pain: Pain level controlled and Adequate analgesia  Post assessment: Post-op Vital signs reviewed, Patient's Cardiovascular Status Stable, Respiratory Function Stable, Patent Airway and Pain level controlled  Last Vitals:  Filed Vitals:   08/17/13 1700  BP: 91/55  Pulse: 72  Temp:   Resp: 14    Post vital signs: Reviewed and stable  Level of consciousness: awake, alert  and oriented  Complications: No apparent anesthesia complications \

## 2013-08-17 NOTE — Anesthesia Preprocedure Evaluation (Signed)
Anesthesia Evaluation  Patient identified by MRN, date of birth, ID band Patient awake    Reviewed: Allergy & Precautions, H&P , NPO status , Patient's Chart, lab work & pertinent test results  History of Anesthesia Complications (+) PONV  Airway Mallampati: II  Neck ROM: full    Dental   Pulmonary Current Smoker,          Cardiovascular hypertension,     Neuro/Psych  Neuromuscular disease    GI/Hepatic GERD-  ,  Endo/Other  obese  Renal/GU      Musculoskeletal  (+) Arthritis -, Fibromyalgia -  Abdominal   Peds  Hematology   Anesthesia Other Findings   Reproductive/Obstetrics                           Anesthesia Physical Anesthesia Plan  ASA: II  Anesthesia Plan: General   Post-op Pain Management:    Induction: Intravenous  Airway Management Planned: Oral ETT  Additional Equipment:   Intra-op Plan:   Post-operative Plan: Extubation in OR  Informed Consent: I have reviewed the patients History and Physical, chart, labs and discussed the procedure including the risks, benefits and alternatives for the proposed anesthesia with the patient or authorized representative who has indicated his/her understanding and acceptance.     Plan Discussed with: CRNA, Anesthesiologist and Surgeon  Anesthesia Plan Comments:         Anesthesia Quick Evaluation

## 2013-08-17 NOTE — Preoperative (Signed)
Beta Blockers   Reason not to administer Beta Blockers:Not Applicable 

## 2013-08-17 NOTE — Op Note (Signed)
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  #2 cervical instability C7-T1  #3 pseudoarthrosis L5-S1  Postoperative diagnosis: Same  Procedure: #1 exploration and removal of hardware L5-S1  #2 through separate skin incision harvesting of iliac crest bone graft  #3 redo posterior lateral fusion L5-S1 with iliac crest bone graft BMP and DBX next  #4 replacement of S1 pedicle screws with globus 8.5 x 45 mm Revere 5.5 pedicle screws  #5 placement of large Hemovac drain  Through a separate skin incision posterior cervical decompressive laminectomy bilateral C7-T1 with bilateral C8 foraminotomies and T1 foraminotomies  #2 posterior cervical fusion with lateral mass screws at C7 and pedicle screws at T1 using the globus lateral mass posterior cervical fusion set and  #3 posterior lateral arthrodesis C7-T1 using iliac crest bone graft and DBX next  #4 placement of a medium Hemovac drain  Surgeon: Jillyn Hidden Silvanna Ohmer  Assistant: Maeola Harman  Anesthesia: Gen.  EBL: Minimal  History of present illness: Patient is a 57 year old female whose had long-standing neck pain bilateral arm pain rating is C8 distribution workup revealed spondylosis with retrolisthesis subluxation by foraminal stenosis at C7-T1 patient failed all forms of conservative treatment so was recommended posterior cervical decompression fusion at this level she had dynamic movement on flexion extension films. In addition patient developed progressive worsening back pain and bilateral leg pain rating down L5 and S1 this patient workup revealed pseudoarthrosis at L5-S1 with loosening of her S1 screws the patient recommended iliac crest bone graft harvest and revision of spinal fusion L5-S1 replacement of S1 pedicle screws. Next is reviewed the risks and benefits of the operation to the patient as well as perioperative course and expectations of outcome alternatives of surgery she understood  and agreed to proceed forward.  Operative procedure: Patient brought into the or was induced under general anesthesia initially was positioned prone in pins her lumbar incision as well as the right correction left iliac crest was exposed attention was first taken to harvesting of the iliac crest to both incisions were infiltrated with 10 cc lidocaine with epi and the harvesting of the posterior superior iliac spine starting proximal 4 fingerbreadths away from the midline and extending another 4 fingerbreadths the lateral aspect of the iliac crest was dissected free using commissure chisels and gouges I. a moderate amount of cortical cancellus bone graft was harvested from the left iliac crest. After adequate harvesting this packed with Gelfoam and closed with interrupted Vicryl a running 4 septic or and skin. At this or posterior lumbar incision midline was opened up the hardware was exposed from L3-S1 9 the rod was cut between L3-4 and the left and L4-5 on the right than extensive posterior lateral dissection was carried out to identify the TPS and solid bone from L4-S1 bilaterally there is an extensive amount of heterotopic bone did not used to have her dose active use of fusion itself did not incorporate into her native posterior laterally x-rays consistent with a pseudoarthrosis at L5-S1 this is all removed so I could identify solid bone at the TPS lateral gutters. After adequate bone and then identified at the L5 DP lateral gutter 245 extending down to the sacral ala the pilot holes the top tightening nuts and S1 screws are to be removed per the holes were probed and felt to be competent plus preoperative CT scan confirmed good good competency and 20 room to add a larger screw so 6 5 x 40 540 screws were removed 8 5  x 45 were inserted up by Cortical purchase in the sacral ala a question of the sacral promontory and the posterior fluoroscopy confirmed good position of the hardware then rods were reapplied from  3 to from 4-1 on the left and from 5-1 on the right extensive amount of decortication was carried out posterior laterally BMP iliac crest bone graft and DBX mix was all packed posterior laterally prior to screw placement I then rods were all torqued down and drain was placed and was closed in layers of Vicryl running 4 septic foot. Then the patient agreed to remove patient was redraped the posterior cervical incision a midline incision was then made from extending from about C6 down to T2 and subperiosteal dissections care lamina of C3-5 671 on the left side and 671 the right interoperative film confirmed that location of C5-6 and so 2 levels below that laminotomies were begun bilaterally radical foraminotomies were carried out decompress the C8 nerve root on the left side there is marked spondylitic compression C8 nerve root mostly from the facet joint I did look at the disc at this side and there was no acute free fragment disc herniation is mostly spondylitic radicular and incised the disc and removed a little bit of disc but this is not the most compressive pathology again the foraminotomies a C8 nerve root was widely decompressed. Then under direct visualization of the T1 pedicle after adequate foraminotomy a ball-tipped C8 and T1 pilot holes were drilled a high-speed drill then 14 mm sequentially up to 18 mm these pedicles holes within probed and a 3.5 x 18 mm pedicle screws placed bilaterally lateral mass screws were inserted at C7 these were 14 mm and again a similar fashion the cortex was tapped pedicle was probed and screws placed Gelfoam was overlaid top of the foraminotomies site aggressive decortication was carried TPS lateral gutters after copious irrigation and the remainder the ICD-9 crest bone graft and DBX next is impacted posterolaterally from C7-T1 and then rods were placed top tightness tightened and torqued down and then a drain was placed and was closed in layers of Vicryl and a running 4  subcuticular in the skin. Benzoin and Steri-Strips applied patient recovered in stable condition. At the adjacent hook and sponge counts were correct.

## 2013-08-17 NOTE — H&P (Signed)
Rachel Allison is an 57 y.o. female.   Chief Complaint: Neck and bilateral arm pain back and bilateral leg pain HPI: Patient is a 57 year old female is a long-standing problems with both her neck and her back she said ACDF down to C7 she's had a lumbar fusion from L2-S1 she's had progressive worsening pain in her neck range of motion arms in her hands the numbness tingling and weakness in her hands and document documented C8 radiculopathy she had imaging that showed progressive spondylosis at C7-T1 with instability and compression of the C8 nerve roots bilaterally and due to her takes her treatment aggressively with physical therapy anti-inflammatories and injections I recommended posterior cervical fusion from C7-T1 have extensively reviewed the risks and benefits of the operation with her as well as perioperative course expectations about alternatives of surgery and she understood and agreed to proceed forward. In addition after previous L2-S1 fusion she developed progressive worsening back and bilateral leg pain mostly hamstrings and calves cramping and numbness and tingling in the bottoms of her feet consistent with an S1 nerve root pattern followup imaging revealed progressive pseudoarthrosis at L5-S1 with loosening of her S1 screws and due to her pseudarthrosis in her progression of clinical syndrome failed conservative treatment I recommended a revision redo fusion L5-S1 with iliac crest bone graft. An extensor reviewed the risks and benefits of the operation with her as well as perioperative course expectations of outcome and alternatives of surgery she understands and agrees to proceed forward.  Past Medical History  Diagnosis Date  . PONV (postoperative nausea and vomiting)   . Hypertension   . GERD (gastroesophageal reflux disease)   . Arthritis   . Fibromyalgia   . Dysrhythmia     Hx: Palpitations    Past Surgical History  Procedure Laterality Date  . Abdominal hysterectomy  27Y RS  .  Cholecystectomy  83YRS  . Toe arthroscopy      ROD 11  . Anterior lat lumbar fusion  09/29/2011    Procedure: ANTERIOR LATERAL LUMBAR FUSION 2 LEVELS;  Surgeon: Mariam Dollar, MD;  Location: [facility];  Service: Neurosurgery;  Laterality: N/A;  Lumbar two- three, three-four,  Anterior Lateral Interbody Fusion  . Anterior cervical decomp/discectomy fusion  03/13/2012    Procedure: ANTERIOR CERVICAL DECOMPRESSION/DISCECTOMY FUSION 3 LEVELS;  Surgeon: Mariam Dollar, MD;  Location: [facility];  Service: Neurosurgery;  Laterality: N/A;  Cervical Four-five,Cervical Five-Six,Cervical Six-Seven Anterior Cervical Decompression and Fusion  . Colonoscopy      Hx: of     Family History  Problem Relation Age of Onset  . Osteoarthritis Mother   . Heart attack Father   . Cancer Father   . Hypertension Father   . Diabetes Sister    Social History:  reports that she has been smoking Cigarettes.  She has a 15 pack-year smoking history. She has never used smokeless tobacco. She reports that she does not drink alcohol or use illicit drugs.  Allergies: No Known Allergies  Medications Prior to Admission  Medication Sig Dispense Refill  . acetaminophen (TYLENOL) 325 MG tablet Take by mouth every 6 (six) hours as needed for mild pain or moderate pain.      . cholecalciferol (VITAMIN D) 1000 UNITS tablet Take 2,000 Units by mouth daily.      Marland Kitchen diltiazem (DILACOR XR) 180 MG 24 hr capsule Take 180 mg by mouth daily.      . DULoxetine (CYMBALTA) 60 MG capsule Take 60 mg by  mouth 2 (two) times daily.      Marland Kitchen esomeprazole (NEXIUM) 40 MG capsule Take 40 mg by mouth daily before breakfast.      . ibuprofen (ADVIL,MOTRIN) 800 MG tablet Take 800 mg by mouth every 8 (eight) hours as needed.      . lidocaine (LIDODERM) 5 % Place 1 patch onto the skin daily as needed. Remove & Discard patch within 12 hours or as directed by MD      . olmesartan (BENICAR) 40 MG tablet Take 40 mg by mouth daily.      . ranitidine (ZANTAC)  300 MG tablet Take 300 mg by mouth at bedtime.      Marland Kitchen zolpidem (AMBIEN) 10 MG tablet Take 10 mg by mouth at bedtime.        Results for orders placed during the hospital encounter of 08/15/13 (from the past 48 hour(s))  SURGICAL PCR SCREEN     Status: None   Collection Time    08/15/13 12:37 PM      Result Value Range   MRSA, PCR NEGATIVE  NEGATIVE   Staphylococcus aureus NEGATIVE  NEGATIVE   Comment:            The Xpert SA Assay (FDA     approved for NASAL specimens     in patients over 3 years of age),     is one component of     a comprehensive surveillance     program.  Test performance has     been validated by [employer] for patients greater     than or equal to 89 year old.     It is not intended     to diagnose infection nor to     guide or monitor treatment.  TYPE AND SCREEN     Status: None   Collection Time    08/15/13 12:45 PM      Result Value Range   ABO/RH(D) O POS     Antibody Screen NEG     Sample Expiration 08/18/2013    CBC     Status: Abnormal   Collection Time    08/15/13 12:49 PM      Result Value Range   WBC 11.3 (*) 4.0 - 10.5 K/uL   RBC 4.74  3.87 - 5.11 MIL/uL   Hemoglobin 14.5  12.0 - 15.0 g/dL   HCT 29.5  28.4 - 13.2 %   MCV 89.7  78.0 - 100.0 fL   MCH 30.6  26.0 - 34.0 pg   MCHC 34.1  30.0 - 36.0 g/dL   RDW 44.0  10.2 - 72.5 %   Platelets 315  150 - 400 K/uL  BASIC METABOLIC PANEL     Status: None   Collection Time    08/15/13 12:49 PM      Result Value Range   Sodium 139  135 - 145 mEq/L   Potassium 4.2  3.5 - 5.1 mEq/L   Chloride 103  96 - 112 mEq/L   CO2 24  19 - 32 mEq/L   Glucose, Bld 96  70 - 99 mg/dL   BUN 10  6 - 23 mg/dL   Creatinine, Ser 3.66  0.50 - 1.10 mg/dL   Calcium 9.4  8.4 - 44.0 mg/dL   GFR calc non Af Amer >90  >90 mL/min   GFR calc Af Amer >90  >90 mL/min   Comment: (NOTE)     The eGFR has been  calculated using the CKD EPI equation.     This calculation has not been validated in all clinical  situations.     eGFR's persistently <90 mL/min signify possible Chronic Kidney     Disease.   Dg Chest 2 View  08/15/2013   CLINICAL DATA:  Preop lumbar fusion  EXAM: CHEST  2 VIEW  COMPARISON:  None.  FINDINGS: There is no focal parenchymal opacity, pleural effusion, or pneumothorax. The heart and mediastinal contours are unremarkable.  There is partial visualized lower anterior cervical fusion. There is partially visualized posterior lumbar spine fusion.  IMPRESSION: No active cardiopulmonary disease.   Electronically Signed   By: Elige Ko   On: 08/15/2013 13:58    Review of Systems  Constitutional: Negative.   Eyes: Negative.   Respiratory: Negative.   Cardiovascular: Negative.   Gastrointestinal: Negative.   Genitourinary: Negative.   Musculoskeletal: Positive for back pain, joint pain, myalgias and neck pain.  Skin: Negative.   Neurological: Positive for tingling and sensory change.  Endo/Heme/Allergies: Negative.     Blood pressure 109/61, pulse 71, temperature 98.4 F (36.9 C), temperature source Oral, resp. rate 18, SpO2 94.00%. Physical Exam  Constitutional: She is oriented to person, place, and time. She appears well-developed and well-nourished.  HENT:  Head: Normocephalic.  Eyes: Pupils are equal, round, and reactive to light.  Neck: Normal range of motion.  Respiratory: Breath sounds normal.  GI: Soft. Bowel sounds are normal.  Neurological: She is alert and oriented to person, place, and time. She has normal strength. GCS eye subscore is 4. GCS verbal subscore is 5. GCS motor subscore is 6.  Reflex Scores:      Tricep reflexes are 1+ on the right side and 1+ on the left side.      Bicep reflexes are 1+ on the right side and 1+ on the left side.      Brachioradialis reflexes are 1+ on the right side and 1+ on the left side.      Patellar reflexes are 0 on the right side and 0 on the left side.      Achilles reflexes are 0 on the right side and 0 on the left  side. Strength is 5 out of 5 in her deltoids, biceps, triceps, wrist flexion, wrist extension, and intrinsics also 5 out of 5 in her iliopsoas, quads, hip she's, gastrocs, anterior tibialis, and EHL.     Assessment/Plan 57 year old female presents for a C7-T1 posterior cervical decompression fusion and L5-S1 revision fusion removal of hardware  Marti Acebo P 08/17/2013, 10:20 AM

## 2013-08-17 NOTE — OR Nursing (Signed)
Lumbar procedure complete at 1345

## 2013-08-17 NOTE — Transfer of Care (Signed)
Immediate Anesthesia Transfer of Care Note  Patient: Rachel Allison  Procedure(s) Performed: Procedure(s) with comments: Lumbar five-Sacral one Posterior Lumbar Arthrodesis, Iliac Crest Bone Graft (N/A) - Lumbar five-Sacral one Posterior Lumbar Arthrodesis, Iliac Crest Bone Graft Cervical seven-Thoracic one Posterior Cervical Fusion with lateral mass fixation (N/A) - Cervical seven-Thoracic one Posterior Cervical Fusion with lateral mass fixation  Patient Location: PACU  Anesthesia Type:General  Level of Consciousness: awake and alert   Airway & Oxygen Therapy: Patient Spontanous Breathing and Patient connected to nasal cannula oxygen  Post-op Assessment: Report given to PACU RN, Post -op Vital signs reviewed and stable and Patient moving all extremities X 4  Post vital signs: Reviewed and stable  Complications: No apparent anesthesia complications

## 2013-08-17 NOTE — Anesthesia Procedure Notes (Signed)
Procedure Name: Intubation Date/Time: 08/17/2013 11:08 AM Performed by: Reine Just Pre-anesthesia Checklist: Patient identified, Emergency Drugs available, Suction available, Patient being monitored and Timeout performed Patient Re-evaluated:Patient Re-evaluated prior to inductionOxygen Delivery Method: Circle system utilized and Simple face mask Preoxygenation: Pre-oxygenation with 100% oxygen Intubation Type: IV induction Ventilation: Mask ventilation without difficulty Grade View: Grade I Tube type: Oral Tube size: 7.5 mm Number of attempts: 1 Airway Equipment and Method: Patient positioned with wedge pillow and Stylet Placement Confirmation: ETT inserted through vocal cords under direct vision,  positive ETCO2 and breath sounds checked- equal and bilateral Secured at: 23 cm Tube secured with: Tape Dental Injury: Teeth and Oropharynx as per pre-operative assessment

## 2013-08-18 MED ORDER — OXYCODONE-ACETAMINOPHEN 5-325 MG PO TABS
1.0000 | ORAL_TABLET | ORAL | Status: DC | PRN
  Administered 2013-08-18 (×2): 1 via ORAL
  Administered 2013-08-18 – 2013-08-19 (×2): 2 via ORAL
  Administered 2013-08-19: 1 via ORAL
  Administered 2013-08-19 – 2013-08-21 (×7): 2 via ORAL
  Filled 2013-08-18 (×5): qty 2
  Filled 2013-08-18: qty 1
  Filled 2013-08-18 (×7): qty 2

## 2013-08-18 NOTE — Plan of Care (Deleted)
Problem: Consults Goal: Diagnosis - Spinal Surgery Cervical Spine Fusion     

## 2013-08-18 NOTE — Progress Notes (Signed)
Pt ambulated twice to the bathroom and once in hallway since receiving back brace and neck collar. Pt educated about plans for mobilization, verbalizes understanding.   Also, pt Foley was D/C this am and pt is now voiding.

## 2013-08-18 NOTE — Progress Notes (Signed)
Filed Vitals:   08/17/13 2144 08/18/13 0211 08/18/13 0512 08/18/13 0909  BP: 92/57 98/42 94/49  91/54  Pulse: 90  70 105  Temp: 97.6 F (36.4 C) 97.8 F (36.6 C) 98.2 F (36.8 C) 99.2 F (37.3 C)  TempSrc: Oral Oral Oral Oral  Resp: 20 20 20 20   Height:      Weight:      SpO2: 94% 95% 99% 92%    CBC  Recent Labs  08/15/13 1249  WBC 11.3*  HGB 14.5  HCT 42.5  PLT 315   BMET  Recent Labs  08/15/13 1249  NA 139  K 4.2  CL 103  CO2 24  GLUCOSE 96  BUN 10  CREATININE 0.77  CALCIUM 9.4    Patient in moderate pain, little drainage into lumbar drain, moderate drainage into cervical drain. Patient did not have cervical collar or lumbar brace. I obtained a soft cervical collar for the patient, and with the nurse applied it. I have ordered a Aspen lumbar corset. Examined lumbar dressing, drain had backed out, it was removed, and the nurse is going to reapply a fresh dressing. Patient to begin to mobilize once Aspen lumbar corset available.  Plan: Progress through postoperative recovery. Will probably DC cervical drain tomorrow.  Hewitt Shorts, MD 08/18/2013, 10:46 AM

## 2013-08-18 NOTE — Progress Notes (Signed)
No orders for cervical nor lumbar braces for pt, none found in PACU. Orders for early ambulation delayed until braces can be ordered and applied, Foley D/C also delayed. MD called, he is currently rounding. Will follow up.  Also, there is moderate drainage at the lumbar op site, gauze soaked though onto the bed with serosanguinous fluid.

## 2013-08-18 NOTE — Progress Notes (Signed)
Brace delivered, applied and ambulation attempted.  Pt sat at bedside and became dizzy/diaphoretic/anxious.  Zofran administered and pt laid back down.  Will re-attempt ambulation later today.

## 2013-08-19 NOTE — Progress Notes (Addendum)
Per verbal MD order, neck Hemovac D/C and HC dressing changed.  Site CDI with steri strips, new HC dressing applied.  Pt out out bed to bathroom this am.  Will encourage increased ambulation today.  Pt ambulated 2X in hallways today and several times to the bathroom.

## 2013-08-19 NOTE — Progress Notes (Signed)
Filed Vitals:   08/18/13 1730 08/18/13 2140 08/19/13 0221 08/19/13 0916  BP: 98/63 106/58 105/53 108/70  Pulse: 82 78 85 91  Temp: 98.6 F (37 C) 98.9 F (37.2 C) 98.3 F (36.8 C) 98 F (36.7 C)  TempSrc: Oral Oral Oral Oral  Resp: 20 16 20 20   Height:      Weight:      SpO2: 93% 92% 93% 93%    Patient with moderate pain, but has ambulated some. Drainage into cervical Hemovac decreasing, we'll have nursing staff change cervical dressing, and removed cervical drain. Encouraged to increase ambulation. Continues on Percocet and Flexeril for pain and muscle spasm respectively.  Plan: Continued to progress through postoperative recovery.  Hewitt Shorts, MD 08/19/2013, 9:35 AM

## 2013-08-20 MED ORDER — METHOCARBAMOL 500 MG PO TABS
500.0000 mg | ORAL_TABLET | Freq: Three times a day (TID) | ORAL | Status: DC | PRN
  Administered 2013-08-21 (×2): 500 mg via ORAL
  Filled 2013-08-20 (×2): qty 1

## 2013-08-20 MED ORDER — BISACODYL 10 MG RE SUPP
10.0000 mg | Freq: Every day | RECTAL | Status: DC | PRN
  Administered 2013-08-20: 10 mg via RECTAL
  Filled 2013-08-20: qty 1

## 2013-08-20 MED ORDER — DIAZEPAM 5 MG PO TABS
5.0000 mg | ORAL_TABLET | Freq: Four times a day (QID) | ORAL | Status: DC | PRN
  Administered 2013-08-20 – 2013-08-21 (×2): 5 mg via ORAL
  Filled 2013-08-20 (×2): qty 1

## 2013-08-20 MED ORDER — FLEET ENEMA 7-19 GM/118ML RE ENEM
1.0000 | ENEMA | Freq: Once | RECTAL | Status: AC
  Administered 2013-08-20: 13:00:00 via RECTAL
  Filled 2013-08-20: qty 1

## 2013-08-20 NOTE — Progress Notes (Signed)
Subjective: Patient reports still quite sore.  Objective: Vital signs in last 24 hours: Temp:  [97.9 F (36.6 C)-99.2 F (37.3 C)] 98.4 F (36.9 C) (12/22 0535) Pulse Rate:  [78-91] 84 (12/22 0535) Resp:  [16-20] 18 (12/22 0535) BP: (95-112)/(48-70) 95/49 mmHg (12/22 0535) SpO2:  [91 %-93 %] 92 % (12/22 0535)  Intake/Output from previous day: 12/21 0701 - 12/22 0700 In: -  Out: 90 [Drains:90] Intake/Output this shift:    Physical Exam: C/o intrascapular pain.  Good strength.  Slow to mobilize.  Lab Results: No results found for this basename: WBC, HGB, HCT, PLT,  in the last 72 hours BMET No results found for this basename: NA, K, CL, CO2, GLUCOSE, BUN, CREATININE, CALCIUM,  in the last 72 hours  Studies/Results: No results found.  Assessment/Plan: Will mobilize today, change muscle relaxants and work on bowels.    LOS: 3 days    Dorian Heckle, MD 08/20/2013, 8:41 AM

## 2013-08-21 MED ORDER — MAGIC MOUTHWASH
5.0000 mL | Freq: Three times a day (TID) | ORAL | Status: DC | PRN
  Administered 2013-08-21: 5 mL via ORAL
  Filled 2013-08-21: qty 5

## 2013-08-21 NOTE — Progress Notes (Signed)
Subjective: Patient reports "I'm ready to go home"  Objective: Vital signs in last 24 hours: Temp:  [97.8 F (36.6 C)-99 F (37.2 C)] 98.6 F (37 C) (12/23 1000) Pulse Rate:  [85-89] 85 (12/23 1000) Resp:  [18] 18 (12/23 1000) BP: (93-102)/(54-69) 102/69 mmHg (12/23 1000) SpO2:  [95 %-96 %] 96 % (12/23 1000)  Intake/Output from previous day:   Intake/Output this shift:    Alert, conversant. Pain well-controlled on PO meds. Honeycomb & steri's lumbar & posterior cervical sites, intact without erythema, sweling, or drainage.  Good strength BLE, BUE. Soft Collar & LSO in use.  Lab Results: No results found for this basename: WBC, HGB, HCT, PLT,  in the last 72 hours BMET No results found for this basename: NA, K, CL, CO2, GLUCOSE, BUN, CREATININE, CALCIUM,  in the last 72 hours  Studies/Results: No results found.  Assessment/Plan: Improved   LOS: 4 days  Per Dr. Venetia Maxon, d/c IV, d/c to home with RW & 3-in-1 commode seat/shower chair & rx's: Robaxin 500mg  1 po up to QID PRN spasm #60, Percocet 5/325 1-2 po q4hrs prn pain #60, & Magic Mouthwash for thrush.  Pt may shower before leaving.  She verbalizes understanding of d/c instructions & agrees to call office to schedule 3-4 week f/u with DrCram.   Rachel Allison 08/21/2013, 11:49 AM

## 2013-08-21 NOTE — Plan of Care (Signed)
D/c instructions given to pt and family. Med script given to pt. Pt is stable to be d/c.

## 2013-08-21 NOTE — Discharge Summary (Cosign Needed)
Physician Discharge Summary  Patient ID: Rachel Allison MRN: [id number] DOB/AGE: 1956-01-29 57 y.o.  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, cervical instability C7-T1, pseudoarthrosis L5-S1; s/p exploration and removal of hardware L5-S, redo posterior lateral fusion L5-S1 with iliac crest bone graft BMP and DBX next, replacement of S1 pedicle screws with globus 8.5 x 45 mm Revere 5.5 pedicle screws posterior cervical decompressive laminectomy bilateral C7-T1 with bilateral C8 foraminotomies and T1 foraminotomies;  posterior cervical fusion with lateral mass screws at C7 and pedicle screws at T1 using the globus lateral mass posterior cervical fusion set and posterior lateral arthrodesis C7-T1 using iliac crest bone graft and DBX   Active Problems:   Pseudoarthrosis of lumbar spine   Discharged Condition: good  Hospital Course: Rachel Allison was admitted for surgery on 08-17-13 with Dx 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  Following uncomplicated surgery, she transferred to [facility] for nursing care and observation. She has progresses steadily.  Consults: None  Significant Diagnostic Studies: radiology: X-Ray: intra-operative  Treatments: surgery: exploration and removal of hardware L5-S, redo posterior lateral fusion L5-S1 with iliac crest bone graft BMP and DBX next, replacement of S1 pedicle screws with globus 8.5 x 45 mm Revere 5.5 pedicle screws posterior cervical decompressive laminectomy bilateral C7-T1 with bilateral C8 foraminotomies and T1 foraminotomies;  posterior cervical fusion with lateral mass screws at C7 and pedicle screws at T1 using the globus lateral  mass posterior cervical fusion set and posterior lateral arthrodesis C7-T1 using iliac crest bone graft and DBX    Discharge Exam: Blood pressure 102/69, pulse 85, temperature 98.6 F (37 C), temperature source Oral, resp. rate 18, height 5\' 3"  (1.6 m), weight 87.091 kg (192 lb), SpO2 96.00%. Alert, conversant. Pain well-controlled on PO meds. Honeycomb & steri's lumbar & posterior cervical sites, intact without erythema, sweling, or drainage.  Good strength BLE, BUE. Soft Collar & LSO in use.   Disposition: 01-Home or Self Care D/C to home with RW & 3-in-1 commode seat/shower chair & rx's: Robaxin 500mg  1 po up to QID PRN spasm #60, Percocet 5/325 1-2 po q4hrs prn pain #60, & Magic Mouthwash for thrush.  Pt may shower before leaving.  She verbalizes understanding of d/c instructions & agrees to call office to schedule 3-4 week f/u with DrCram. She will stop Ibuprofen.      Medication List    ASK your doctor about these medications       acetaminophen 325 MG tablet  Commonly known as:  TYLENOL  Take by mouth every 6 (six) hours as needed for mild pain or moderate pain.     cholecalciferol 1000 UNITS tablet  Commonly known as:  VITAMIN D  Take 2,000 Units by mouth daily.     diltiazem 180 MG 24 hr capsule  Commonly known as:  DILACOR XR  Take 180 mg by mouth daily.     DULoxetine 60 MG capsule  Commonly known as:  CYMBALTA  Take 60 mg by mouth 2 (two) times daily.     esomeprazole 40 MG capsule  Commonly known as:  NEXIUM  Take 40 mg by mouth daily before breakfast.     ibuprofen 800 MG tablet  Commonly known as:  ADVIL,MOTRIN  Take 800 mg by mouth every 8 (eight) hours as needed.  lidocaine 5 %  Commonly known as:  LIDODERM  Place 1 patch onto the skin daily as needed. Remove & Discard patch within 12 hours or as directed by MD     olmesartan 40 MG tablet  Commonly known as:  BENICAR  Take 40 mg by mouth daily.     ranitidine 300 MG tablet  Commonly known as:   ZANTAC  Take 300 mg by mouth at bedtime.     zolpidem 10 MG tablet  Commonly known as:  AMBIEN  Take 10 mg by mouth at bedtime.         Signed: Georgiann Cocker 08/21/2013, 11:58 AM

## 2013-08-22 ENCOUNTER — Encounter (HOSPITAL_COMMUNITY): Payer: Self-pay | Admitting: Neurosurgery

## 2013-09-07 NOTE — Discharge Summary (Signed)
NAMGaspar Bidding:  Mclinden, Jenisha              ACCOUNT NO.:  [id number]  MEDICAL RECORD NO.:  [id number]  LOCATION:  [id number]                        FACILITY:  [facility]  PHYSICIAN:  Donalee CitrinGary Zandrea Kenealy, M.D.        DATE OF BIRTH:  19-Apr-1956  DATE OF ADMISSION:  08/17/2013 DATE OF DISCHARGE:  08/21/2013                              DISCHARGE SUMMARY   Rachel Allison was admitted on 08/17/2013 and discharged on 08/20/2013. Discharge summary has already been dictated.          ______________________________ Donalee CitrinGary Moritz Lever, M.D.     GC/MEDQ  D:  09/06/2013  T:  09/07/2013  Job:  [id number]

## 2013-11-23 ENCOUNTER — Other Ambulatory Visit: Payer: Self-pay | Admitting: Neurosurgery

## 2013-11-23 DIAGNOSIS — S32009K Unspecified fracture of unspecified lumbar vertebra, subsequent encounter for fracture with nonunion: Secondary | ICD-10-CM

## 2014-01-04 ENCOUNTER — Ambulatory Visit
Admission: RE | Admit: 2014-01-04 | Discharge: 2014-01-04 | Disposition: A | Discharge status: Home / Self Care | Payer: Managed Care, Other (non HMO) | Source: Ambulatory Visit | Attending: Neurosurgery | Admitting: Neurosurgery

## 2014-01-04 DIAGNOSIS — S32009K Unspecified fracture of unspecified lumbar vertebra, subsequent encounter for fracture with nonunion: Secondary | ICD-10-CM

## 2014-01-10 ENCOUNTER — Other Ambulatory Visit: Payer: Managed Care, Other (non HMO)

## 2015-09-29 ENCOUNTER — Other Ambulatory Visit (HOSPITAL_BASED_OUTPATIENT_CLINIC_OR_DEPARTMENT_OTHER): Payer: Self-pay | Admitting: Neurosurgery

## 2015-09-29 DIAGNOSIS — M5416 Radiculopathy, lumbar region: Secondary | ICD-10-CM

## 2015-10-03 ENCOUNTER — Ambulatory Visit (HOSPITAL_BASED_OUTPATIENT_CLINIC_OR_DEPARTMENT_OTHER)
Admission: RE | Admit: 2015-10-03 | Discharge: 2015-10-03 | Disposition: A | Discharge status: Home / Self Care | Payer: Medicare Other | Source: Ambulatory Visit | Attending: Neurosurgery | Admitting: Neurosurgery

## 2015-10-03 ENCOUNTER — Other Ambulatory Visit (HOSPITAL_BASED_OUTPATIENT_CLINIC_OR_DEPARTMENT_OTHER): Payer: Self-pay | Admitting: Neurosurgery

## 2015-10-03 DIAGNOSIS — M2578 Osteophyte, vertebrae: Secondary | ICD-10-CM | POA: Insufficient documentation

## 2015-10-03 DIAGNOSIS — M5136 Other intervertebral disc degeneration, lumbar region: Secondary | ICD-10-CM

## 2015-10-03 DIAGNOSIS — M47898 Other spondylosis, sacral and sacrococcygeal region: Secondary | ICD-10-CM | POA: Diagnosis not present

## 2015-10-03 DIAGNOSIS — M419 Scoliosis, unspecified: Secondary | ICD-10-CM | POA: Diagnosis not present

## 2015-10-03 DIAGNOSIS — M5416 Radiculopathy, lumbar region: Secondary | ICD-10-CM | POA: Diagnosis not present

## 2015-10-03 DIAGNOSIS — M545 Low back pain: Secondary | ICD-10-CM | POA: Insufficient documentation

## 2015-10-03 DIAGNOSIS — M4806 Spinal stenosis, lumbar region: Secondary | ICD-10-CM | POA: Diagnosis not present

## 2015-10-03 DIAGNOSIS — M5414 Radiculopathy, thoracic region: Secondary | ICD-10-CM

## 2015-10-03 DIAGNOSIS — M5134 Other intervertebral disc degeneration, thoracic region: Secondary | ICD-10-CM | POA: Insufficient documentation

## 2016-04-16 ENCOUNTER — Other Ambulatory Visit (HOSPITAL_BASED_OUTPATIENT_CLINIC_OR_DEPARTMENT_OTHER): Payer: Self-pay | Admitting: Orthopaedic Surgery

## 2016-04-16 DIAGNOSIS — M79672 Pain in left foot: Principal | ICD-10-CM

## 2016-04-16 DIAGNOSIS — M79671 Pain in right foot: Secondary | ICD-10-CM

## 2016-04-17 ENCOUNTER — Other Ambulatory Visit: Payer: Self-pay | Admitting: Neurosurgery

## 2016-04-17 DIAGNOSIS — M5137 Other intervertebral disc degeneration, lumbosacral region: Secondary | ICD-10-CM

## 2016-04-19 ENCOUNTER — Ambulatory Visit (HOSPITAL_BASED_OUTPATIENT_CLINIC_OR_DEPARTMENT_OTHER)
Admission: RE | Admit: 2016-04-19 | Discharge: 2016-04-19 | Disposition: A | Discharge status: Home / Self Care | Payer: Medicare Other | Source: Ambulatory Visit | Attending: Orthopaedic Surgery | Admitting: Orthopaedic Surgery

## 2016-04-19 DIAGNOSIS — M79671 Pain in right foot: Secondary | ICD-10-CM

## 2016-04-19 DIAGNOSIS — M79672 Pain in left foot: Secondary | ICD-10-CM | POA: Insufficient documentation

## 2016-05-01 ENCOUNTER — Ambulatory Visit
Admission: RE | Admit: 2016-05-01 | Discharge: 2016-05-01 | Disposition: A | Discharge status: Home / Self Care | Payer: Medicare Other | Source: Ambulatory Visit | Attending: Neurosurgery | Admitting: Neurosurgery

## 2016-05-01 DIAGNOSIS — M5137 Other intervertebral disc degeneration, lumbosacral region: Secondary | ICD-10-CM

## 2018-04-20 ENCOUNTER — Other Ambulatory Visit (HOSPITAL_BASED_OUTPATIENT_CLINIC_OR_DEPARTMENT_OTHER): Payer: Self-pay | Admitting: Student

## 2018-04-20 DIAGNOSIS — M546 Pain in thoracic spine: Secondary | ICD-10-CM

## 2018-04-22 ENCOUNTER — Encounter (INDEPENDENT_AMBULATORY_CARE_PROVIDER_SITE_OTHER): Payer: Self-pay

## 2018-04-22 ENCOUNTER — Ambulatory Visit (HOSPITAL_BASED_OUTPATIENT_CLINIC_OR_DEPARTMENT_OTHER)
Admission: RE | Admit: 2018-04-22 | Discharge: 2018-04-22 | Disposition: A | Discharge status: Home / Self Care | Payer: Medicare Other | Source: Ambulatory Visit | Attending: Student | Admitting: Student

## 2018-04-22 DIAGNOSIS — M546 Pain in thoracic spine: Secondary | ICD-10-CM | POA: Diagnosis not present

## 2018-04-22 DIAGNOSIS — M47814 Spondylosis without myelopathy or radiculopathy, thoracic region: Secondary | ICD-10-CM | POA: Insufficient documentation

## 2018-04-22 DIAGNOSIS — M4804 Spinal stenosis, thoracic region: Secondary | ICD-10-CM | POA: Diagnosis not present

## 2018-07-03 ENCOUNTER — Emergency Department (HOSPITAL_COMMUNITY): Payer: Medicare Other

## 2018-07-03 ENCOUNTER — Observation Stay (HOSPITAL_COMMUNITY)
Admission: EM | Admit: 2018-07-03 | Discharge: 2018-07-07 | Disposition: A | Discharge status: Home / Self Care | Payer: Medicare Other | Attending: General Surgery | Admitting: General Surgery

## 2018-07-03 ENCOUNTER — Other Ambulatory Visit: Payer: Self-pay

## 2018-07-03 ENCOUNTER — Encounter (HOSPITAL_COMMUNITY): Payer: Self-pay | Admitting: Emergency Medicine

## 2018-07-03 ENCOUNTER — Inpatient Hospital Stay (HOSPITAL_COMMUNITY): Payer: Medicare Other

## 2018-07-03 DIAGNOSIS — S52502A Unspecified fracture of the lower end of left radius, initial encounter for closed fracture: Secondary | ICD-10-CM | POA: Diagnosis present

## 2018-07-03 DIAGNOSIS — Z981 Arthrodesis status: Secondary | ICD-10-CM | POA: Diagnosis not present

## 2018-07-03 DIAGNOSIS — S32810A Multiple fractures of pelvis with stable disruption of pelvic ring, initial encounter for closed fracture: Secondary | ICD-10-CM

## 2018-07-03 DIAGNOSIS — Z419 Encounter for procedure for purposes other than remedying health state, unspecified: Secondary | ICD-10-CM

## 2018-07-03 DIAGNOSIS — F17210 Nicotine dependence, cigarettes, uncomplicated: Secondary | ICD-10-CM | POA: Diagnosis not present

## 2018-07-03 DIAGNOSIS — S42102A Fracture of unspecified part of scapula, left shoulder, initial encounter for closed fracture: Secondary | ICD-10-CM | POA: Diagnosis present

## 2018-07-03 DIAGNOSIS — I10 Essential (primary) hypertension: Secondary | ICD-10-CM | POA: Insufficient documentation

## 2018-07-03 DIAGNOSIS — S32810B Multiple fractures of pelvis with stable disruption of pelvic ring, initial encounter for open fracture: Secondary | ICD-10-CM | POA: Diagnosis present

## 2018-07-03 DIAGNOSIS — S2232XA Fracture of one rib, left side, initial encounter for closed fracture: Secondary | ICD-10-CM

## 2018-07-03 DIAGNOSIS — S52572A Other intraarticular fracture of lower end of left radius, initial encounter for closed fracture: Principal | ICD-10-CM

## 2018-07-03 DIAGNOSIS — E559 Vitamin D deficiency, unspecified: Secondary | ICD-10-CM | POA: Diagnosis not present

## 2018-07-03 DIAGNOSIS — Z79899 Other long term (current) drug therapy: Secondary | ICD-10-CM | POA: Diagnosis not present

## 2018-07-03 DIAGNOSIS — D62 Acute posthemorrhagic anemia: Secondary | ICD-10-CM | POA: Diagnosis not present

## 2018-07-03 DIAGNOSIS — W109XXA Fall (on) (from) unspecified stairs and steps, initial encounter: Secondary | ICD-10-CM | POA: Insufficient documentation

## 2018-07-03 DIAGNOSIS — Y92098 Other place in other non-institutional residence as the place of occurrence of the external cause: Secondary | ICD-10-CM | POA: Insufficient documentation

## 2018-07-03 DIAGNOSIS — S62109A Fracture of unspecified carpal bone, unspecified wrist, initial encounter for closed fracture: Secondary | ICD-10-CM

## 2018-07-03 DIAGNOSIS — M797 Fibromyalgia: Secondary | ICD-10-CM | POA: Diagnosis not present

## 2018-07-03 DIAGNOSIS — S42192B Fracture of other part of scapula, left shoulder, initial encounter for open fracture: Secondary | ICD-10-CM | POA: Insufficient documentation

## 2018-07-03 DIAGNOSIS — K219 Gastro-esophageal reflux disease without esophagitis: Secondary | ICD-10-CM | POA: Diagnosis not present

## 2018-07-03 DIAGNOSIS — W19XXXA Unspecified fall, initial encounter: Secondary | ICD-10-CM | POA: Diagnosis present

## 2018-07-03 DIAGNOSIS — S52302A Unspecified fracture of shaft of left radius, initial encounter for closed fracture: Secondary | ICD-10-CM | POA: Diagnosis not present

## 2018-07-03 DIAGNOSIS — T148XXA Other injury of unspecified body region, initial encounter: Secondary | ICD-10-CM

## 2018-07-03 DIAGNOSIS — W108XXA Fall (on) (from) other stairs and steps, initial encounter: Secondary | ICD-10-CM

## 2018-07-03 DIAGNOSIS — S32302A Unspecified fracture of left ilium, initial encounter for closed fracture: Secondary | ICD-10-CM

## 2018-07-03 HISTORY — DX: Other chronic pain: G89.29

## 2018-07-03 HISTORY — DX: Failed or difficult intubation, initial encounter: T88.4XXA

## 2018-07-03 HISTORY — DX: Personal history of peptic ulcer disease: Z87.11

## 2018-07-03 HISTORY — DX: Fracture of unspecified part of scapula, left shoulder, initial encounter for closed fracture: S42.102A

## 2018-07-03 HISTORY — DX: Personal history of other diseases of the digestive system: Z87.19

## 2018-07-03 HISTORY — DX: Multiple fractures of pelvis with stable disruption of pelvic ring, initial encounter for open fracture: S32.810B

## 2018-07-03 HISTORY — DX: Dorsalgia, unspecified: M54.9

## 2018-07-03 LAB — CBC WITH DIFFERENTIAL/PLATELET
ABS IMMATURE GRANULOCYTES: 0.09 10*3/uL — AB (ref 0.00–0.07)
BASOS ABS: 0.1 10*3/uL (ref 0.0–0.1)
Basophils Relative: 1 %
EOS PCT: 1 %
Eosinophils Absolute: 0.1 10*3/uL (ref 0.0–0.5)
HEMATOCRIT: 39.5 % (ref 36.0–46.0)
HEMOGLOBIN: 12.5 g/dL (ref 12.0–15.0)
IMMATURE GRANULOCYTES: 1 %
Lymphocytes Relative: 10 %
Lymphs Abs: 1.5 10*3/uL (ref 0.7–4.0)
MCH: 28.8 pg (ref 26.0–34.0)
MCHC: 31.6 g/dL (ref 30.0–36.0)
MCV: 91 fL (ref 80.0–100.0)
Monocytes Absolute: 0.8 10*3/uL (ref 0.1–1.0)
Monocytes Relative: 5 %
NEUTROS ABS: 12.5 10*3/uL — AB (ref 1.7–7.7)
NEUTROS PCT: 82 %
Platelets: 268 10*3/uL (ref 150–400)
RBC: 4.34 MIL/uL (ref 3.87–5.11)
RDW: 13.9 % (ref 11.5–15.5)
WBC: 15 10*3/uL — AB (ref 4.0–10.5)
nRBC: 0 % (ref 0.0–0.2)

## 2018-07-03 LAB — BASIC METABOLIC PANEL
Anion gap: 5 (ref 5–15)
BUN: 14 mg/dL (ref 8–23)
CO2: 23 mmol/L (ref 22–32)
Calcium: 9.1 mg/dL (ref 8.9–10.3)
Chloride: 108 mmol/L (ref 98–111)
Creatinine, Ser: 0.76 mg/dL (ref 0.44–1.00)
GFR calc Af Amer: >60 mL/min (ref >60)
GLUCOSE: 115 mg/dL — AB (ref 70–99)
POTASSIUM: 4.2 mmol/L (ref 3.5–5.1)
Sodium: 136 mmol/L (ref 135–145)

## 2018-07-03 LAB — SURGICAL PCR SCREEN
MRSA, PCR: NEGATIVE
Staphylococcus aureus: NEGATIVE

## 2018-07-03 MED ORDER — HYDROMORPHONE HCL 1 MG/ML IJ SOLN
0.5000 mg | INTRAMUSCULAR | Status: AC | PRN
  Administered 2018-07-03 – 2018-07-06 (×4): 0.5 mg via INTRAVENOUS
  Filled 2018-07-03 (×4): qty 1

## 2018-07-03 MED ORDER — IOHEXOL 300 MG/ML  SOLN
100.0000 mL | Freq: Once | INTRAMUSCULAR | Status: AC | PRN
  Administered 2018-07-03: 100 mL via INTRAVENOUS

## 2018-07-03 MED ORDER — HYDROMORPHONE HCL 1 MG/ML IJ SOLN
1.0000 mg | Freq: Once | INTRAMUSCULAR | Status: AC
  Administered 2018-07-03: 1 mg via INTRAVENOUS
  Filled 2018-07-03: qty 1

## 2018-07-03 MED ORDER — OXYCODONE HCL 5 MG PO TABS
5.0000 mg | ORAL_TABLET | ORAL | Status: DC | PRN
  Administered 2018-07-03 – 2018-07-07 (×6): 10 mg via ORAL
  Filled 2018-07-03 (×6): qty 2

## 2018-07-03 MED ORDER — ONDANSETRON HCL 4 MG/2ML IJ SOLN: 4.0000 mg | Freq: Four times a day (QID) | INTRAMUSCULAR | Status: DC | PRN

## 2018-07-03 MED ORDER — DULOXETINE HCL 60 MG PO CPEP
60.0000 mg | ORAL_CAPSULE | Freq: Two times a day (BID) | ORAL | Status: DC
  Administered 2018-07-03 – 2018-07-07 (×8): 60 mg via ORAL
  Filled 2018-07-03 (×9): qty 1

## 2018-07-03 MED ORDER — ONDANSETRON HCL 4 MG/2ML IJ SOLN
4.0000 mg | Freq: Once | INTRAMUSCULAR | Status: AC
  Administered 2018-07-03: 4 mg via INTRAVENOUS
  Filled 2018-07-03: qty 2

## 2018-07-03 MED ORDER — MORPHINE SULFATE (PF) 2 MG/ML IV SOLN
2.0000 mg | INTRAVENOUS | Status: DC | PRN
  Administered 2018-07-03 – 2018-07-04 (×4): 4 mg via INTRAVENOUS
  Filled 2018-07-03 (×4): qty 2

## 2018-07-03 MED ORDER — PANTOPRAZOLE SODIUM 40 MG PO TBEC
40.0000 mg | DELAYED_RELEASE_TABLET | Freq: Every day | ORAL | Status: DC
  Administered 2018-07-03 – 2018-07-07 (×4): 40 mg via ORAL
  Filled 2018-07-03 (×5): qty 1

## 2018-07-03 MED ORDER — ENOXAPARIN SODIUM 40 MG/0.4ML ~~LOC~~ SOLN
40.0000 mg | SUBCUTANEOUS | Status: DC
  Administered 2018-07-03 – 2018-07-07 (×4): 40 mg via SUBCUTANEOUS
  Filled 2018-07-03 (×5): qty 0.4

## 2018-07-03 MED ORDER — MORPHINE SULFATE (PF) 4 MG/ML IV SOLN
4.0000 mg | Freq: Once | INTRAVENOUS | Status: AC
  Administered 2018-07-03: 4 mg via INTRAVENOUS
  Filled 2018-07-03: qty 1

## 2018-07-03 MED ORDER — POLYETHYLENE GLYCOL 3350 17 G PO PACK
17.0000 g | PACK | Freq: Every day | ORAL | Status: DC
  Administered 2018-07-03 – 2018-07-06 (×3): 17 g via ORAL
  Filled 2018-07-03 (×5): qty 1

## 2018-07-03 MED ORDER — ETOMIDATE 2 MG/ML IV SOLN: 0.2000 mg/kg | Freq: Once | INTRAVENOUS | Status: DC

## 2018-07-03 MED ORDER — ACETAMINOPHEN 325 MG PO TABS
650.0000 mg | ORAL_TABLET | ORAL | Status: DC
  Administered 2018-07-03 – 2018-07-04 (×6): 650 mg via ORAL
  Filled 2018-07-03 (×7): qty 2

## 2018-07-03 MED ORDER — MIDAZOLAM HCL 2 MG/2ML IJ SOLN
INTRAMUSCULAR | Status: AC
  Filled 2018-07-03: qty 2

## 2018-07-03 MED ORDER — MIDAZOLAM HCL 5 MG/5ML IJ SOLN
1.0000 mg | Freq: Once | INTRAMUSCULAR | Status: AC
  Administered 2018-07-03: 1 mg via INTRAVENOUS

## 2018-07-03 MED ORDER — DOCUSATE SODIUM 100 MG PO CAPS
100.0000 mg | ORAL_CAPSULE | Freq: Two times a day (BID) | ORAL | Status: DC
  Administered 2018-07-03 – 2018-07-07 (×8): 100 mg via ORAL
  Filled 2018-07-03 (×9): qty 1

## 2018-07-03 MED ORDER — DILTIAZEM HCL ER COATED BEADS 180 MG PO CP24
180.0000 mg | ORAL_CAPSULE | Freq: Every day | ORAL | Status: DC
  Administered 2018-07-03 – 2018-07-07 (×4): 180 mg via ORAL
  Filled 2018-07-03 (×5): qty 1

## 2018-07-03 MED ORDER — SODIUM CHLORIDE 0.9 % IV SOLN
INTRAVENOUS | Status: DC
  Administered 2018-07-03 – 2018-07-04 (×3): via INTRAVENOUS

## 2018-07-03 MED ORDER — LIDOCAINE-EPINEPHRINE (PF) 2 %-1:200000 IJ SOLN
20.0000 mL | Freq: Once | INTRAMUSCULAR | Status: AC
  Administered 2018-07-03: 20 mL
  Filled 2018-07-03: qty 20

## 2018-07-03 MED ORDER — HYDRALAZINE HCL 20 MG/ML IJ SOLN: 10.0000 mg | INTRAMUSCULAR | Status: DC | PRN

## 2018-07-03 MED ORDER — ONDANSETRON 4 MG PO TBDP: 4.0000 mg | ORAL_TABLET | Freq: Four times a day (QID) | ORAL | Status: DC | PRN

## 2018-07-03 MED ORDER — IRBESARTAN 300 MG PO TABS
300.0000 mg | ORAL_TABLET | Freq: Every day | ORAL | Status: DC
  Administered 2018-07-03 – 2018-07-07 (×4): 300 mg via ORAL
  Filled 2018-07-03 (×5): qty 1

## 2018-07-03 MED ORDER — CEFAZOLIN SODIUM-DEXTROSE 2-4 GM/100ML-% IV SOLN
2.0000 g | INTRAVENOUS | Status: AC
  Administered 2018-07-04: 2 g via INTRAVENOUS
  Filled 2018-07-03: qty 100

## 2018-07-03 NOTE — Progress Notes (Signed)
Orthopedic Tech Progress Note Patient Details:  Rachel Allison Sep 11, 1955 [id number]  Ortho Devices Type of Ortho Device: Sugartong splint Ortho Device/Splint Location: lue Ortho Device/Splint Interventions: Application   Post Interventions Patient Tolerated: Well Instructions Provided: Care of device   [name] 07/03/2018, 11:09 AM

## 2018-07-03 NOTE — ED Notes (Signed)
EDP notified on pt.'s persistent left wrist , left scapula and left groin pain .

## 2018-07-03 NOTE — ED Notes (Signed)
Patient currently at CT scan .  

## 2018-07-03 NOTE — ED Provider Notes (Addendum)
multiple orthopedic injuries:  Left distal radius fx Left scapula fx Left superior and inferior pubic rami fracture Left rib fracture  I personally reviewed the imaging tests through PACS system  Consultants: Charma Igo, [place] - Orthopedics Dr Janee Morn, MD - Trauma    >>>>>>>>>>>>>>>>>>>>>>>>>>>>>>>>>>>>>>  Reduction of Fracture Performed by: Azalia Bilis Consent: Verbal consent obtained. Risks and benefits: risks, benefits and alternatives were discussed Consent given by: patient Required items: required blood products, implants, devices, and special equipment available Time out: Immediately prior to procedure a "time out" was called to verify the correct patient, procedure, equipment, support staff and site/side marked as required.  Patient sedated: versed  Vitals: Vital signs were monitored during sedation. Patient tolerance: Patient tolerated the procedure well with no immediate complications. Bone: Distal left radius Reduction technique: manipulation with C arm verification    HEMATOMA BLOCK Performed by: Azalia Bilis Consent: Verbal consent obtained. Required items: required blood products, implants, devices, and special equipment available Time out: Immediately prior to procedure a "time out" was called to verify the correct patient, procedure, equipment, support staff and site/side marked as required. Indication: Fracture Reduction Hematoma block body site: Distal left radius Preparation: Patient was prepped and draped in the usual sterile fashion. Needle gauge: 24 G Location technique: anatomical landmarks Local anesthetic: lidocaine 2% with epi Anesthetic total: 15 ml Outcome: pain improved Patient tolerance: Patient tolerated the procedure well with no immediate complications.     >>>>>>>>>>>>>>>>>>>>>>>>>>>>>>>>>>>>>>>     Dispo: Admit for pain control and orthopedic management of fractures, PT, pain control    Azalia Bilis, MD 07/03/18  0900    Azalia Bilis, MD 07/04/18 [phone]

## 2018-07-03 NOTE — ED Notes (Signed)
Ortho tech at bedside applying splint at left arm .

## 2018-07-03 NOTE — Consult Note (Signed)
Reason for Consult:Polytrauma Referring Physician: [name] is an 62 y.o. female.  HPI: Rachel was climbing a short set of stairs and lost her balance and fell backwards about 3 steps. She had immediate pain. She was brought to the ED where x-rays showed multiple fxs and orthopedic surgery was consulted. She c/o mostly left wrist pain.  Past Medical History:  Diagnosis Date  . Arthritis   . Dysrhythmia    Hx: Palpitations  . Fibromyalgia   . GERD (gastroesophageal reflux disease)   . Hypertension   . PONV (postoperative nausea and vomiting)     Past Surgical History:  Procedure Laterality Date  . ABDOMINAL HYSTERECTOMY  27Y RS  . ANTERIOR CERVICAL DECOMP/DISCECTOMY FUSION  03/13/2012   Procedure: ANTERIOR CERVICAL DECOMPRESSION/DISCECTOMY FUSION 3 LEVELS;  Surgeon: Elaina Hoops, MD;  Location: [facility];  Service: Neurosurgery;  Laterality: N/A;  Cervical Four-five,Cervical Five-Six,Cervical Six-Seven Anterior Cervical Decompression and Fusion  . ANTERIOR LAT LUMBAR FUSION  09/29/2011   Procedure: ANTERIOR LATERAL LUMBAR FUSION 2 LEVELS;  Surgeon: Elaina Hoops, MD;  Location: [facility];  Service: Neurosurgery;  Laterality: N/A;  Lumbar two- three, three-four,  Anterior Lateral Interbody Fusion  . CHOLECYSTECTOMY  14YRS  . COLONOSCOPY     Hx: of   . LAMINECTOMY WITH POSTERIOR LATERAL ARTHRODESIS LEVEL 1 N/A 08/17/2013   Procedure: Lumbar five-Sacral one Posterior Lumbar Arthrodesis, Iliac Crest Bone Graft;  Surgeon: Elaina Hoops, MD;  Location: [facility];  Service: Neurosurgery;  Laterality: N/A;  Lumbar five-Sacral one Posterior Lumbar Arthrodesis, Iliac Crest Bone Graft  . POSTERIOR CERVICAL FUSION/FORAMINOTOMY N/A 08/17/2013   Procedure: Cervical seven-Thoracic one Posterior Cervical Fusion with lateral mass fixation;  Surgeon: Elaina Hoops, MD;  Location: [facility];  Service: Neurosurgery;  Laterality: N/A;  Cervical seven-Thoracic one Posterior Cervical Fusion  with lateral mass fixation  . TOE ARTHROSCOPY     ROD 11    Family History  Problem Relation Age of Onset  . Osteoarthritis Mother   . Heart attack Father   . Cancer Father   . Hypertension Father   . Diabetes Sister     Social History:  reports that she has been smoking cigarettes. She has a 15.00 pack-year smoking history. She has never used smokeless tobacco. She reports that she does not drink alcohol or use drugs.  Allergies: No Known Allergies  Medications: I have reviewed the patient's current medications.  Results for orders placed or performed during the hospital encounter of 07/03/18 (from the past 48 hour(s))  Basic metabolic panel     Status: Abnormal   Collection Time: 07/03/18  5:51 AM  Result Value Ref Range   Sodium 136 135 - 145 mmol/L   Potassium 4.2 3.5 - 5.1 mmol/L   Chloride 108 98 - 111 mmol/L   CO2 23 22 - 32 mmol/L   Glucose, Bld 115 (H) 70 - 99 mg/dL   BUN 14 8 - 23 mg/dL   Creatinine, Ser 0.76 0.44 - 1.00 mg/dL   Calcium 9.1 8.9 - 10.3 mg/dL   GFR calc non Af Amer >60 >60 mL/min   GFR calc Af Amer >60 >60 mL/min    Comment: (NOTE) The eGFR has been calculated using the CKD EPI equation. This calculation has not been validated in all clinical situations. eGFR's persistently <60 mL/min signify possible Chronic Kidney Disease.    Anion gap 5 5 - 15    Comment: Performed at [facility]  [facility], [place] [address] [place], [place] [address]  CBC with Differential     Status: Abnormal   Collection Time: 07/03/18  5:51 AM  Result Value Ref Range   WBC 15.0 (H) 4.0 - 10.5 K/uL   RBC 4.34 3.87 - 5.11 MIL/uL   Hemoglobin 12.5 12.0 - 15.0 g/dL   HCT 39.5 36.0 - 46.0 %   MCV 91.0 80.0 - 100.0 fL   MCH 28.8 26.0 - 34.0 pg   MCHC 31.6 30.0 - 36.0 g/dL   RDW 13.9 11.5 - 15.5 %   Platelets 268 150 - 400 K/uL   nRBC 0.0 0.0 - 0.2 %   Neutrophils Relative % 82 %   Neutro Abs 12.5 (H) 1.7 - 7.7 K/uL   Lymphocytes Relative 10 %   Lymphs Abs 1.5 0.7 - 4.0  K/uL   Monocytes Relative 5 %   Monocytes Absolute 0.8 0.1 - 1.0 K/uL   Eosinophils Relative 1 %   Eosinophils Absolute 0.1 0.0 - 0.5 K/uL   Basophils Relative 1 %   Basophils Absolute 0.1 0.0 - 0.1 K/uL   Immature Granulocytes 1 %   Abs Immature Granulocytes 0.09 (H) 0.00 - 0.07 K/uL    Comment: Performed at [facility] [address] [place], [place] [address]    Dg Thoracic Spine W/swimmers  Result Date: 07/03/2018 CLINICAL DATA:  Fall EXAM: THORACIC SPINE - 3 VIEWS COMPARISON:  None. FINDINGS: There is no evidence of thoracic spine fracture. Alignment is normal. No other significant bone abnormalities are identified. There is lower cervical and lower thoracic fusion hardware. IMPRESSION: Negative. Electronically Signed   By: Ulyses Jarred M.D.   On: 07/03/2018 05:29   Dg Pelvis 1-2 Views  Result Date: 07/03/2018 CLINICAL DATA:  Fall EXAM: PELVIS - 1-2 VIEW COMPARISON:  None. FINDINGS: There are minimally displaced fractures of the left superior and inferior pubic rami. No fracture of the left hip. IMPRESSION: Minimally displaced fractures of the left inferior and superior pubic rami. Electronically Signed   By: Ulyses Jarred M.D.   On: 07/03/2018 05:25   Dg Scapula Left  Result Date: 07/03/2018 CLINICAL DATA:  Fall EXAM: LEFT SCAPULA - 2+ VIEWS COMPARISON:  None. FINDINGS: There is a nondisplaced fracture at the superior border of the scapula. No intra-articular extension is visualized. Glenohumeral joint remains approximated. IMPRESSION: Nondisplaced fracture at the superior border of the scapula. Electronically Signed   By: Ulyses Jarred M.D.   On: 07/03/2018 05:27   Dg Wrist Complete Left  Result Date: 07/03/2018 CLINICAL DATA:  Fall EXAM: LEFT WRIST - COMPLETE 3+ VIEW COMPARISON:  None. FINDINGS: Comminuted fracture of the distal left radius with dorsal angulation and displacement. Fracture involves the articular surface of the radius there is a major oblique fracture line  extending approximately 4.5 cm proximally into the humeral shaft. No ulna fracture. 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 IMPRESSION: Comminuted fracture of the distal left radius with dorsal angulation and displacement. Electronically Signed   By: Ulyses Jarred M.D.   On: 07/03/2018 05:30   Ct Chest W Contrast  Result Date: 07/03/2018 CLINICAL DATA:  Missed step and tripped landing on left side last night. Left wrist, scapula and groin pain. EXAM: CT CHEST, ABDOMEN, AND PELVIS WITH CONTRAST TECHNIQUE: Multidetector CT imaging of the chest, abdomen and pelvis was performed following the standard protocol during bolus administration of intravenous contrast. CONTRAST:  115m OMNIPAQUE IOHEXOL 300 MG/ML  SOLN COMPARISON:  CT  chest 02/26/2013 and CT abdomen/pelvis 09/22/2012 FINDINGS: CT CHEST FINDINGS Cardiovascular: Heart is normal size. Vascular structures are unremarkable. Mediastinum/Nodes: No mediastinal or hilar adenopathy. No mediastinal fluid. Lungs/Pleura: Lungs are adequately inflated and demonstrate mild dependent posterior bibasilar atelectasis. There are a few small bilateral calcified granulomas. Airways are normal. Musculoskeletal: Subtle fracture of the left posterior fourth rib. Subtle very minimally displaced fracture of the lateral aspect of the left scapula inferior to the glenoid. CT ABDOMEN PELVIS FINDINGS Hepatobiliary: Previous cholecystectomy. Liver and biliary tree are normal. Pancreas: Normal. Spleen: Normal. Adrenals/Urinary Tract: 1.5 cm right adrenal gland and 1.8 cm left adrenal gland likely adenomas. Kidneys are normal in size without hydronephrosis or nephrolithiasis. Ureters and bladder are normal. Stomach/Bowel: Stomach and small bowel are normal. Appendix not visualized. Mild colonic diverticulosis. Vascular/Lymphatic: Normal. Reproductive: Previous hysterectomy. Other: No free fluid or focal inflammatory change. No free peritoneal air. Tiny umbilical  hernia containing only peritoneal fat. Musculoskeletal: Minimally displaced left inferior and superior pubic ramus fractures. Posterior fusion hardware from L1-S1 intact. Mild degenerate change of the spine. Moderate disc disease at the T12-L1 level. IMPRESSION: Subtle fracture of the posterior left fourth rib with very minimally displaced fracture along the lateral aspect of the left scapula inferior to the glenoid. Minimally displaced left superior and inferior pubic rami fractures. No acute solid organ injury. Small bilateral adrenal masses likely adenomas. Mild colonic diverticulosis. Electronically Signed   By: Marin Olp M.D.   On: 07/03/2018 07:41   Ct Cervical Spine Wo Contrast  Result Date: 07/03/2018 CLINICAL DATA:  Fall EXAM: CT CERVICAL SPINE WITHOUT CONTRAST TECHNIQUE: Multidetector CT imaging of the cervical spine was performed without intravenous contrast. Multiplanar CT image reconstructions were also generated. COMPARISON:  None. FINDINGS: Alignment: No static subluxation. Facets are aligned. Occipital condyles and the lateral masses of C1 and C2 are normally approximated. Skull base and vertebrae: C4-7 ACDF and C7-T1 posterior fixation without hardware abnormality. No acute fracture. Soft tissues and spinal canal: No prevertebral fluid or swelling. No visible canal hematoma. Disc levels: No advanced spinal canal or neural foraminal stenosis. Upper chest: No pneumothorax, pulmonary nodule or pleural effusion. Other: Normal visualized paraspinal cervical soft tissues. IMPRESSION: No fracture or static subluxation of the cervical spine. Electronically Signed   By: Ulyses Jarred M.D.   On: 07/03/2018 05:42   Ct Abdomen Pelvis W Contrast  Result Date: 07/03/2018 CLINICAL DATA:  Missed step and tripped landing on left side last night. Left wrist, scapula and groin pain. EXAM: CT CHEST, ABDOMEN, AND PELVIS WITH CONTRAST TECHNIQUE: Multidetector CT imaging of the chest, abdomen and pelvis was  performed following the standard protocol during bolus administration of intravenous contrast. CONTRAST:  117m OMNIPAQUE IOHEXOL 300 MG/ML  SOLN COMPARISON:  CT chest 02/26/2013 and CT abdomen/pelvis 09/22/2012 FINDINGS: CT CHEST FINDINGS Cardiovascular: Heart is normal size. Vascular structures are unremarkable. Mediastinum/Nodes: No mediastinal or hilar adenopathy. No mediastinal fluid. Lungs/Pleura: Lungs are adequately inflated and demonstrate mild dependent posterior bibasilar atelectasis. There are a few small bilateral calcified granulomas. Airways are normal. Musculoskeletal: Subtle fracture of the left posterior fourth rib. Subtle very minimally displaced fracture of the lateral aspect of the left scapula inferior to the glenoid. CT ABDOMEN PELVIS FINDINGS Hepatobiliary: Previous cholecystectomy. Liver and biliary tree are normal. Pancreas: Normal. Spleen: Normal. Adrenals/Urinary Tract: 1.5 cm right adrenal gland and 1.8 cm left adrenal gland likely adenomas. Kidneys are normal in size without hydronephrosis or nephrolithiasis. Ureters and bladder are normal. Stomach/Bowel: Stomach and small bowel are  normal. Appendix not visualized. Mild colonic diverticulosis. Vascular/Lymphatic: Normal. Reproductive: Previous hysterectomy. Other: No free fluid or focal inflammatory change. No free peritoneal air. Tiny umbilical hernia containing only peritoneal fat. Musculoskeletal: Minimally displaced left inferior and superior pubic ramus fractures. Posterior fusion hardware from L1-S1 intact. Mild degenerate change of the spine. Moderate disc disease at the T12-L1 level. IMPRESSION: Subtle fracture of the posterior left fourth rib with very minimally displaced fracture along the lateral aspect of the left scapula inferior to the glenoid. Minimally displaced left superior and inferior pubic rami fractures. No acute solid organ injury. Small bilateral adrenal masses likely adenomas. Mild colonic diverticulosis.  Electronically Signed   By: Marin Olp M.D.   On: 07/03/2018 07:41    Review of Systems  Constitutional: Negative for weight loss.  HENT: Negative for ear discharge, ear pain, hearing loss and tinnitus.   Eyes: Negative for blurred vision, double vision, photophobia and pain.  Respiratory: Negative for cough, sputum production and shortness of breath.   Cardiovascular: Negative for chest pain.  Gastrointestinal: Negative for abdominal pain, nausea and vomiting.  Genitourinary: Negative for dysuria, flank pain, frequency and urgency.  Musculoskeletal: Positive for joint pain (Left wrist, shoulder). Negative for back pain, falls, myalgias and neck pain.  Neurological: Negative for dizziness, tingling, sensory change, focal weakness, loss of consciousness and headaches.  Endo/Heme/Allergies: Does not bruise/bleed easily.  Psychiatric/Behavioral: Negative for depression, memory loss and substance abuse. The patient is not nervous/anxious.    Blood pressure 118/75, pulse 82, temperature 97.7 F (36.5 C), temperature source Temporal, resp. rate 14, weight 86 kg, SpO2 94 %. Physical Exam  Constitutional: She appears well-developed and well-nourished. No distress.  HENT:  Head: Normocephalic and atraumatic.  Eyes: Conjunctivae are normal. Right eye exhibits no discharge. Left eye exhibits no discharge. No scleral icterus.  Neck: Normal range of motion.  Cardiovascular: Normal rate and regular rhythm.  Respiratory: Effort normal. No respiratory distress.  Musculoskeletal:  Left shoulder, elbow, wrist, digits- no skin wounds, in sling, posterior TTP shoulder, sugar tong splint in place  Sens  Ax/R/M/U grossly intact  Mot   Ax/ R/ PIN/ M/ AIN/ U grossly intact  Pelvis--no traumatic wounds or rash, no ecchymosis, stable to manual stress, mild TTP left  LLE No traumatic wounds, ecchymosis, or rash  Nontender  No knee or ankle effusion  Knee stable to varus/ valgus and anterior/posterior  stress  Sens DPN, SPN, TN intact  Motor EHL, ext, flex, evers 5/5  DP 2+, PT 2+, No significant edema  Neurological: She is alert.  Skin: Skin is warm and dry. She is not diaphoretic.  Psychiatric: She has a normal mood and affect. Her behavior is normal.    Assessment/Plan: Fall Left scapula fx -- May be WBAT, no operative intervention indicated Left distal radius fx -- Will need reduction and then eventual ORIF, timing TBD. NWB for now. Left sup/inf pubic rami fxs -- WBAT, no operative intervention indicated Left rib fx HTN GERD Fibromyalgia    Lisette Abu, PA-C Orthopedic Surgery [phone] 07/03/2018, 9:56 AM

## 2018-07-03 NOTE — ED Notes (Signed)
Patient transported to X-ray 

## 2018-07-03 NOTE — ED Triage Notes (Signed)
Patient arrived with EMS from her RV , missed her step and tripped this evening landed on her left left side , denies LOC , reports left wrist pain with mild swelling , left scapula and left groin pain . Respirations unlabored , alert and oriented , CBG=183, she received Fentanyl 200 mcg by EMS with mild relief.

## 2018-07-03 NOTE — Progress Notes (Signed)
Orthopedic Tech Progress Note Patient Details:  Rachel Allison [birth year] [id number]  Ortho Devices Type of Ortho Device: Arm sling, Sugartong splint Ortho Device/Splint Location: lue Ortho Device/Splint Interventions: Ordered, Application, Adjustment   Post Interventions Patient Tolerated: Well Instructions Provided: Care of device, Adjustment of device   Trinna Post 07/03/2018, 6:25 AM

## 2018-07-03 NOTE — H&P (Signed)
[facility] Surgery Consult/Admission Note  Rachel Allison September 19, 1955  [id number].    Requesting MD: Dr. Venora Maples Chief Complaint/Reason for Consult: Fall  HPI:  Pt is a 62 yo female with a history of HTN, GERD, cervical and lumbar disc fusion, fibromyalgia, who presented to the ED after a fall. Pt lives in a 71f camper and was going to the bathroom this am when she fell down 3 steps. She did not hit the steps and landed on her left side. She states no LOC. Dizziness after the fall. No CP, SOB, abdominal pain. She is having constant severe, non radiating, sharp pain, worse with movement in her L wrist. Mild pain in her L shoulder. Severe pain in L pelvis with movement of her LLE. No other complaints or associated symptoms. No numbness or weakness. No urinary symptoms and bowels movements have been normal. Daughter at bedside. She states pt can live with her after discharge from the hospital.  ROS:  Review of Systems  Constitutional: Negative for chills, diaphoresis and fever.  HENT: Negative for sore throat.   Respiratory: Negative for cough and shortness of breath.   Cardiovascular: Negative for chest pain.  Gastrointestinal: Negative for abdominal pain, blood in stool, constipation, diarrhea, nausea and vomiting.  Genitourinary: Negative for dysuria.  Musculoskeletal: Positive for falls and joint pain. Negative for back pain and neck pain.  Skin: Negative for rash.  Neurological: Positive for dizziness. Negative for focal weakness and loss of consciousness.  All other systems reviewed and are negative.    Family History  Problem Relation Age of Onset  . Osteoarthritis Mother   . Heart attack Father   . Cancer Father   . Hypertension Father   . Diabetes Sister     Past Medical History:  Diagnosis Date  . Arthritis   . Dysrhythmia    Hx: Palpitations  . Fibromyalgia   . GERD (gastroesophageal reflux disease)   . Hypertension   . PONV (postoperative nausea and  vomiting)     Past Surgical History:  Procedure Laterality Date  . ABDOMINAL HYSTERECTOMY  27Y RS  . ANTERIOR CERVICAL DECOMP/DISCECTOMY FUSION  03/13/2012   Procedure: ANTERIOR CERVICAL DECOMPRESSION/DISCECTOMY FUSION 3 LEVELS;  Surgeon: GElaina Hoops MD;  [facility];  Service: Neurosurgery;  Laterality: N/A;  Cervical Four-five,Cervical Five-Six,Cervical Six-Seven Anterior Cervical Decompression and Fusion  . ANTERIOR LAT LUMBAR FUSION  09/29/2011   Procedure: ANTERIOR LATERAL LUMBAR FUSION 2 LEVELS;  Surgeon: GElaina Hoops MD;  [facility];  Service: Neurosurgery;  Laterality: N/A;  Lumbar two- three, three-four,  Anterior Lateral Interbody Fusion  . CHOLECYSTECTOMY  89YRS  . COLONOSCOPY     Hx: of   . LAMINECTOMY WITH POSTERIOR LATERAL ARTHRODESIS LEVEL 1 N/A 08/17/2013   Procedure: Lumbar five-Sacral one Posterior Lumbar Arthrodesis, Iliac Crest Bone Graft;  Surgeon: GElaina Hoops MD;  [facility];  Service: Neurosurgery;  Laterality: N/A;  Lumbar five-Sacral one Posterior Lumbar Arthrodesis, Iliac Crest Bone Graft  . POSTERIOR CERVICAL FUSION/FORAMINOTOMY N/A 08/17/2013   Procedure: Cervical seven-Thoracic one Posterior Cervical Fusion with lateral mass fixation;  Surgeon: GElaina Hoops MD;  [facility];  Service: Neurosurgery;  Laterality: N/A;  Cervical seven-Thoracic one Posterior Cervical Fusion with lateral mass fixation  . TOE ARTHROSCOPY     ROD 11    Social History:  reports that she has been smoking cigarettes. She has a 15.00 pack-year smoking history. She has never used smokeless tobacco. She  reports that she does not drink alcohol or use drugs.  Allergies: No Known Allergies   (Not in a hospital admission)  Blood pressure 118/75, pulse 82, temperature 97.7 F (36.5 C), temperature source Temporal, resp. rate 14, weight 86 kg, SpO2 94 %.  Physical Exam  Constitutional: She is oriented to person, place, and time. Vital signs are  normal. She appears well-developed and well-nourished. She is cooperative. No distress.  HENT:  Head: Normocephalic and atraumatic.  Nose: Nose normal.  Mouth/Throat: Uvula is midline and oropharynx is clear and moist. Mucous membranes are not pale and dry.  Eyes: Pupils are equal, round, and reactive to light. Conjunctivae and lids are normal.  Neck: Full passive range of motion without pain. No tracheal deviation present. No thyromegaly present.  Cardiovascular: Normal rate, regular rhythm, normal heart sounds and intact distal pulses. Exam reveals no gallop and no friction rub.  No murmur heard. Pulses:      Radial pulses are 2+ on the right side, and 2+ on the left side.       Dorsalis pedis pulses are 2+ on the right side, and 2+ on the left side.  Pulmonary/Chest: Effort normal and breath sounds normal. No respiratory distress. She has no wheezes. She has no rhonchi. She has no rales.  Abdominal: Soft. Normal appearance and bowel sounds are normal. She exhibits no distension. There is no hepatosplenomegaly. There is no tenderness. There is no guarding.  Musculoskeletal: She exhibits tenderness and deformity. She exhibits no edema.  Splint to L wrist and sling to LUE, did not assess ROM of LLE, wiggles b/l toes appropriately.   Neurological: She is alert and oriented to person, place, and time. No sensory deficit. GCS eye subscore is 4. GCS verbal subscore is 5. GCS motor subscore is 6.  No motor deficits  Skin: Skin is warm and dry. No rash noted. She is not diaphoretic.  Psychiatric: Her behavior is normal. Her mood appears anxious.  Nursing note and vitals reviewed.   Results for orders placed or performed during the hospital encounter of 07/03/18 (from the past 48 hour(s))  Basic metabolic panel     Status: Abnormal   Collection Time: 07/03/18  5:51 AM  Result Value Ref Range   Sodium 136 135 - 145 mmol/L   Potassium 4.2 3.5 - 5.1 mmol/L   Chloride 108 98 - 111 mmol/L   CO2 23  22 - 32 mmol/L   Glucose, Bld 115 (H) 70 - 99 mg/dL   BUN 14 8 - 23 mg/dL   Creatinine, Ser 0.76 0.44 - 1.00 mg/dL   Calcium 9.1 8.9 - 10.3 mg/dL   GFR calc non Af Amer >60 >60 mL/min   GFR calc Af Amer >60 >60 mL/min    Comment: (NOTE) The eGFR has been calculated using the CKD EPI equation. This calculation has not been validated in all clinical situations. eGFR's persistently <60 mL/min signify possible Chronic Kidney Disease.    Anion gap 5 5 - 15    Comment: Performed at [facility] [address], [place], [place] [address]  CBC with Differential     Status: Abnormal   Collection Time: 07/03/18  5:51 AM  Result Value Ref Range   WBC 15.0 (H) 4.0 - 10.5 K/uL   RBC 4.34 3.87 - 5.11 MIL/uL   Hemoglobin 12.5 12.0 - 15.0 g/dL   HCT 39.5 36.0 - 46.0 %   MCV 91.0 80.0 - 100.0 fL   MCH 28.8  26.0 - 34.0 pg   MCHC 31.6 30.0 - 36.0 g/dL   RDW 13.9 11.5 - 15.5 %   Platelets 268 150 - 400 K/uL   nRBC 0.0 0.0 - 0.2 %   Neutrophils Relative % 82 %   Neutro Abs 12.5 (H) 1.7 - 7.7 K/uL   Lymphocytes Relative 10 %   Lymphs Abs 1.5 0.7 - 4.0 K/uL   Monocytes Relative 5 %   Monocytes Absolute 0.8 0.1 - 1.0 K/uL   Eosinophils Relative 1 %   Eosinophils Absolute 0.1 0.0 - 0.5 K/uL   Basophils Relative 1 %   Basophils Absolute 0.1 0.0 - 0.1 K/uL   Immature Granulocytes 1 %   Abs Immature Granulocytes 0.09 (H) 0.00 - 0.07 K/uL    Comment: Performed at [facility] [address], [place], [place] [address]   Dg Thoracic Spine W/swimmers  Result Date: 07/03/2018 CLINICAL DATA:  Fall EXAM: THORACIC SPINE - 3 VIEWS COMPARISON:  None. FINDINGS: There is no evidence of thoracic spine fracture. Alignment is normal. No other significant bone abnormalities are identified. There is lower cervical and lower thoracic fusion hardware. IMPRESSION: Negative. Electronically Signed   By: Ulyses Jarred M.D.   On: 07/03/2018 05:29   Dg Pelvis 1-2 Views  Result Date: 07/03/2018 CLINICAL  DATA:  Fall EXAM: PELVIS - 1-2 VIEW COMPARISON:  None. FINDINGS: There are minimally displaced fractures of the left superior and inferior pubic rami. No fracture of the left hip. IMPRESSION: Minimally displaced fractures of the left inferior and superior pubic rami. Electronically Signed   By: Ulyses Jarred M.D.   On: 07/03/2018 05:25   Dg Scapula Left  Result Date: 07/03/2018 CLINICAL DATA:  Fall EXAM: LEFT SCAPULA - 2+ VIEWS COMPARISON:  None. FINDINGS: There is a nondisplaced fracture at the superior border of the scapula. No intra-articular extension is visualized. Glenohumeral joint remains approximated. IMPRESSION: Nondisplaced fracture at the superior border of the scapula. Electronically Signed   By: Ulyses Jarred M.D.   On: 07/03/2018 05:27   Dg Wrist Complete Left  Result Date: 07/03/2018 CLINICAL DATA:  Fall EXAM: LEFT WRIST - COMPLETE 3+ VIEW COMPARISON:  None. FINDINGS: Comminuted fracture of the distal left radius with dorsal angulation and displacement. Fracture involves the articular surface of the radius there is a major oblique fracture line extending approximately 4.5 cm proximally into the humeral shaft. No ulna fracture. 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 IMPRESSION: Comminuted fracture of the distal left radius with dorsal angulation and displacement. Electronically Signed   By: Ulyses Jarred M.D.   On: 07/03/2018 05:30   Ct Chest W Contrast  Result Date: 07/03/2018 CLINICAL DATA:  Missed step and tripped landing on left side last night. Left wrist, scapula and groin pain. EXAM: CT CHEST, ABDOMEN, AND PELVIS WITH CONTRAST TECHNIQUE: Multidetector CT imaging of the chest, abdomen and pelvis was performed following the standard protocol during bolus administration of intravenous contrast. CONTRAST:  148m OMNIPAQUE IOHEXOL 300 MG/ML  SOLN COMPARISON:  CT chest 02/26/2013 and CT abdomen/pelvis 09/22/2012 FINDINGS: CT CHEST FINDINGS Cardiovascular: Heart is  normal size. Vascular structures are unremarkable. Mediastinum/Nodes: No mediastinal or hilar adenopathy. No mediastinal fluid. Lungs/Pleura: Lungs are adequately inflated and demonstrate mild dependent posterior bibasilar atelectasis. There are a few small bilateral calcified granulomas. Airways are normal. Musculoskeletal: Subtle fracture of the left posterior fourth rib. Subtle very minimally displaced fracture of the lateral aspect of the left scapula inferior to the glenoid. CT ABDOMEN  PELVIS FINDINGS Hepatobiliary: Previous cholecystectomy. Liver and biliary tree are normal. Pancreas: Normal. Spleen: Normal. Adrenals/Urinary Tract: 1.5 cm right adrenal gland and 1.8 cm left adrenal gland likely adenomas. Kidneys are normal in size without hydronephrosis or nephrolithiasis. Ureters and bladder are normal. Stomach/Bowel: Stomach and small bowel are normal. Appendix not visualized. Mild colonic diverticulosis. Vascular/Lymphatic: Normal. Reproductive: Previous hysterectomy. Other: No free fluid or focal inflammatory change. No free peritoneal air. Tiny umbilical hernia containing only peritoneal fat. Musculoskeletal: Minimally displaced left inferior and superior pubic ramus fractures. Posterior fusion hardware from L1-S1 intact. Mild degenerate change of the spine. Moderate disc disease at the T12-L1 level. IMPRESSION: Subtle fracture of the posterior left fourth rib with very minimally displaced fracture along the lateral aspect of the left scapula inferior to the glenoid. Minimally displaced left superior and inferior pubic rami fractures. No acute solid organ injury. Small bilateral adrenal masses likely adenomas. Mild colonic diverticulosis. Electronically Signed   By: Marin Olp M.D.   On: 07/03/2018 07:41   Ct Cervical Spine Wo Contrast  Result Date: 07/03/2018 CLINICAL DATA:  Fall EXAM: CT CERVICAL SPINE WITHOUT CONTRAST TECHNIQUE: Multidetector CT imaging of the cervical spine was performed  without intravenous contrast. Multiplanar CT image reconstructions were also generated. COMPARISON:  None. FINDINGS: Alignment: No static subluxation. Facets are aligned. Occipital condyles and the lateral masses of C1 and C2 are normally approximated. Skull base and vertebrae: C4-7 ACDF and C7-T1 posterior fixation without hardware abnormality. No acute fracture. Soft tissues and spinal canal: No prevertebral fluid or swelling. No visible canal hematoma. Disc levels: No advanced spinal canal or neural foraminal stenosis. Upper chest: No pneumothorax, pulmonary nodule or pleural effusion. Other: Normal visualized paraspinal cervical soft tissues. IMPRESSION: No fracture or static subluxation of the cervical spine. Electronically Signed   By: Ulyses Jarred M.D.   On: 07/03/2018 05:42   Ct Abdomen Pelvis W Contrast  Result Date: 07/03/2018 CLINICAL DATA:  Missed step and tripped landing on left side last night. Left wrist, scapula and groin pain. EXAM: CT CHEST, ABDOMEN, AND PELVIS WITH CONTRAST TECHNIQUE: Multidetector CT imaging of the chest, abdomen and pelvis was performed following the standard protocol during bolus administration of intravenous contrast. CONTRAST:  165m OMNIPAQUE IOHEXOL 300 MG/ML  SOLN COMPARISON:  CT chest 02/26/2013 and CT abdomen/pelvis 09/22/2012 FINDINGS: CT CHEST FINDINGS Cardiovascular: Heart is normal size. Vascular structures are unremarkable. Mediastinum/Nodes: No mediastinal or hilar adenopathy. No mediastinal fluid. Lungs/Pleura: Lungs are adequately inflated and demonstrate mild dependent posterior bibasilar atelectasis. There are a few small bilateral calcified granulomas. Airways are normal. Musculoskeletal: Subtle fracture of the left posterior fourth rib. Subtle very minimally displaced fracture of the lateral aspect of the left scapula inferior to the glenoid. CT ABDOMEN PELVIS FINDINGS Hepatobiliary: Previous cholecystectomy. Liver and biliary tree are normal. Pancreas:  Normal. Spleen: Normal. Adrenals/Urinary Tract: 1.5 cm right adrenal gland and 1.8 cm left adrenal gland likely adenomas. Kidneys are normal in size without hydronephrosis or nephrolithiasis. Ureters and bladder are normal. Stomach/Bowel: Stomach and small bowel are normal. Appendix not visualized. Mild colonic diverticulosis. Vascular/Lymphatic: Normal. Reproductive: Previous hysterectomy. Other: No free fluid or focal inflammatory change. No free peritoneal air. Tiny umbilical hernia containing only peritoneal fat. Musculoskeletal: Minimally displaced left inferior and superior pubic ramus fractures. Posterior fusion hardware from L1-S1 intact. Mild degenerate change of the spine. Moderate disc disease at the T12-L1 level. IMPRESSION: Subtle fracture of the posterior left fourth rib with very minimally displaced fracture along the lateral aspect of  the left scapula inferior to the glenoid. Minimally displaced left superior and inferior pubic rami fractures. No acute solid organ injury. Small bilateral adrenal masses likely adenomas. Mild colonic diverticulosis. Electronically Signed   By: Marin Olp M.D.   On: 07/03/2018 07:41      Assessment/Plan Active Problems:   * No active hospital problems. * HTN - home meds GERD - protonix PO  Fall Left scapula fx -- May be WBAT, no operative intervention indicated, sling Left distal radius fx -- EDP to reduce and splint. eventual ORIF, timing TBD. NWB for now. Left sup/inf pubic rami fxs -- WBAT, no operative intervention indicated Left rib fx - pain control, IS  FEN: Regular diet VTE: SCD's, lovenox ID: no abx indicated Foley: none Follow up: tbd  Plan: EDP to reduce L wrist. Admit to trauma. Pain control. Pulm toilet. PT/OT pending. Possible ORIF of L wrist this admission per ortho.   Kalman Drape, [facility] Surgery 07/03/2018, 10:19 AM Pager: [phone] Mon-Fri 7:00 am-4:30 pm Sat-Sun 7:00 am-11:30 am

## 2018-07-03 NOTE — ED Provider Notes (Signed)
[facility] EMERGENCY DEPARTMENT Provider Note   CSN: [id number] Arrival date & time: 07/03/18  0319     History   Chief Complaint Chief Complaint  Patient presents with  . Fall    HPI Rachel Allison is a 62 y.o. female.  The history is provided by the patient.  She has history of hypertension, fibromyalgia, GERD and comes in following a fall at home.  She was walking to the bathroom and missed a step and fell down injuring her left wrist, pelvis, back, neck.  She is complaining of pain in her neck, left scapula, upper back, pelvis, left wrist.  Pain is rated at 10/10.  She denies head injury.  She is not on any anticoagulants.  Of note, she is status post lumbar surgery and cervical surgery.  Past Medical History:  Diagnosis Date  . Arthritis   . Dysrhythmia    Hx: Palpitations  . Fibromyalgia   . GERD (gastroesophageal reflux disease)   . Hypertension   . PONV (postoperative nausea and vomiting)     Patient Active Problem List   Diagnosis Date Noted  . 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 ABDOMINAL HYSTERECTOMY  27Y RS  . ANTERIOR CERVICAL DECOMP/DISCECTOMY FUSION  03/13/2012   Procedure: ANTERIOR CERVICAL DECOMPRESSION/DISCECTOMY FUSION 3 LEVELS;  Surgeon: Mariam Dollar, MD;  Location: [facility];  Service: Neurosurgery;  Laterality: N/A;  Cervical Four-five,Cervical Five-Six,Cervical Six-Seven Anterior Cervical Decompression and Fusion  . ANTERIOR LAT LUMBAR FUSION  09/29/2011   Procedure: ANTERIOR LATERAL LUMBAR FUSION 2 LEVELS;  Surgeon: Mariam Dollar, MD;  Location: [facility];  Service: Neurosurgery;  Laterality: N/A;  Lumbar two- three, three-four,  Anterior Lateral Interbody Fusion  . CHOLECYSTECTOMY  73YRS  . COLONOSCOPY     Hx: of   . LAMINECTOMY WITH POSTERIOR LATERAL ARTHRODESIS LEVEL 1 N/A 08/17/2013   Procedure: Lumbar five-Sacral one Posterior Lumbar Arthrodesis, Iliac  Crest Bone Graft;  Surgeon: Mariam Dollar, MD;  Location: [facility];  Service: Neurosurgery;  Laterality: N/A;  Lumbar five-Sacral one Posterior Lumbar Arthrodesis, Iliac Crest Bone Graft  . POSTERIOR CERVICAL FUSION/FORAMINOTOMY N/A 08/17/2013   Procedure: Cervical seven-Thoracic one Posterior Cervical Fusion with lateral mass fixation;  Surgeon: Mariam Dollar, MD;  Location: [facility];  Service: Neurosurgery;  Laterality: N/A;  Cervical seven-Thoracic one Posterior Cervical Fusion with lateral mass fixation  . TOE ARTHROSCOPY     ROD 11     OB History   None      Home Medications    Prior to Admission medications   Medication Sig Start Date End Date Taking? Authorizing Provider  acetaminophen (TYLENOL) 325 MG tablet Take by mouth every 6 (six) hours as needed for mild pain or moderate pain.    [provider]  cholecalciferol (VITAMIN D) 1000 UNITS tablet Take 2,000 Units by mouth daily.    [provider]  diltiazem (DILACOR XR) 180 MG 24 hr capsule Take 180 mg by mouth daily.    [provider]  DULoxetine (CYMBALTA) 60 MG capsule Take 60 mg by mouth 2 (two) times daily.    [provider]  esomeprazole (NEXIUM) 40 MG capsule Take 40 mg by mouth daily before breakfast.    [provider]  lidocaine (LIDODERM) 5 % Place 1 patch onto the skin daily as needed. Remove & Discard patch within 12 hours or as directed by MD  [provider]  olmesartan (BENICAR) 40 MG tablet Take 40 mg by mouth daily.    [provider]  ranitidine (ZANTAC) 300 MG tablet Take 300 mg by mouth at bedtime.    [provider]  zolpidem (AMBIEN) 10 MG tablet Take 10 mg by mouth at bedtime.    [provider]    Family History Family History  Problem Relation Age of Onset  . Osteoarthritis Mother   . Heart attack Father   . Cancer Father   . Hypertension Father   . Diabetes Sister     Social History Social History    Tobacco Use  . Smoking status: Current Every Day Smoker    Packs/day: 0.50    Years: 30.00    Pack years: 15.00    Types: Cigarettes  . Smokeless tobacco: Never Used  Substance Use Topics  . Alcohol use: No  . Drug use: No     Allergies   Patient has no known allergies.   Review of Systems Review of Systems  All other systems reviewed and are negative.    Physical Exam Updated Vital Signs BP 118/75   Pulse 82   Temp 97.7 F (36.5 C) (Temporal)   Resp 14   SpO2 94%   Physical Exam  Nursing note and vitals reviewed.  62 year old female, appears to be in pain, but is in no acute distress. Vital signs are normal. Oxygen saturation is 94%, which is normal. Head is normocephalic and atraumatic. PERRLA, EOMI. Oropharynx is clear. Neck has mild tenderness in the mid and lower cervical region without adenopathy or JVD. Back has mild tenderness in the mid and upper thoracic area and over the left scapula.  There is no CVA tenderness. Lungs are clear without rales, wheezes, or rhonchi. Chest is nontender. Heart has regular rate and rhythm without murmur. Abdomen is soft, flat, nontender without masses or hepatosplenomegaly and peristalsis is normoactive. Pelvis: Stable and without deformity.  Mild tenderness to palpation over the symphysis pubis. Extremities: Swelling and deformity of the left wrist, pain with any palpation or movement.  Distal neurovascular exam is intact.  Full range of motion of all other joints without pain. Skin is warm and dry without rash. Neurologic: Mental status is normal, cranial nerves are intact, there are no motor or sensory deficits.  ED Treatments / Results  Labs (all labs ordered are listed, but only abnormal results are displayed) Labs Reviewed  BASIC METABOLIC PANEL - Abnormal; Notable for the following components:      Result Value   Glucose, Bld 115 (*)    All other components within normal limits  CBC WITH DIFFERENTIAL/PLATELET -  Abnormal; Notable for the following components:   WBC 15.0 (*)    Neutro Abs 12.5 (*)    Abs Immature Granulocytes 0.09 (*)    All other components within normal limits    EKG EKG Interpretation  Date/Time:  Monday July 03 2018 05:54:18 EST Ventricular Rate:  79 PR Interval:    QRS Duration: 103 QT Interval:  398 QTC Calculation: 457 R Axis:   61 Text Interpretation:  Sinus rhythm Normal ECG Artifact When compared with ECG of 08/15/2013, No significant change was found Confirmed by Dione Booze ([id number]) on 07/03/2018 6:13:21 AM Also confirmed by Dione Booze ([id number]), editor Elita Quick [phone])  on 07/03/2018 7:22:11 AM   Radiology Dg Thoracic Spine W/swimmers  Result Date: 07/03/2018 CLINICAL DATA:  Fall EXAM: THORACIC SPINE - 3 VIEWS COMPARISON:  None. FINDINGS: There is no evidence of thoracic spine fracture. Alignment is normal. No other significant bone abnormalities are identified. There is lower cervical and lower thoracic fusion hardware. IMPRESSION: Negative. Electronically Signed   By: Deatra Robinson M.D.   On: 07/03/2018 05:29   Dg Pelvis 1-2 Views  Result Date: 07/03/2018 CLINICAL DATA:  Fall EXAM: PELVIS - 1-2 VIEW COMPARISON:  None. FINDINGS: There are minimally displaced fractures of the left superior and inferior pubic rami. No fracture of the left hip. IMPRESSION: Minimally displaced fractures of the left inferior and superior pubic rami. Electronically Signed   By: Deatra Robinson M.D.   On: 07/03/2018 05:25   Dg Scapula Left  Result Date: 07/03/2018 CLINICAL DATA:  Fall EXAM: LEFT SCAPULA - 2+ VIEWS COMPARISON:  None. FINDINGS: There is a nondisplaced fracture at the superior border of the scapula. No intra-articular extension is visualized. Glenohumeral joint remains approximated. IMPRESSION: Nondisplaced fracture at the superior border of the scapula. Electronically Signed   By: Deatra Robinson M.D.   On: 07/03/2018 05:27   Dg Wrist Complete Left  Result  Date: 07/03/2018 CLINICAL DATA:  Fall EXAM: LEFT WRIST - COMPLETE 3+ VIEW COMPARISON:  None. FINDINGS: Comminuted fracture of the distal left radius with dorsal angulation and displacement. Fracture involves the articular surface of the radius there is a major oblique fracture line extending approximately 4.5 cm proximally into the humeral shaft. No ulna fracture. 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 IMPRESSION: Comminuted fracture of the distal left radius with dorsal angulation and displacement. Electronically Signed   By: Deatra Robinson M.D.   On: 07/03/2018 05:30   Ct Chest W Contrast  Result Date: 07/03/2018 CLINICAL DATA:  Missed step and tripped landing on left side last night. Left wrist, scapula and groin pain. EXAM: CT CHEST, ABDOMEN, AND PELVIS WITH CONTRAST TECHNIQUE: Multidetector CT imaging of the chest, abdomen and pelvis was performed following the standard protocol during bolus administration of intravenous contrast. CONTRAST:  OMNIPAQUE IOHEXOL 300 MG/ML  SOLN COMPARISON:  CT chest 02/26/2013 and CT abdomen/pelvis 09/22/2012 FINDINGS: CT CHEST FINDINGS Cardiovascular: Heart is normal size. Vascular structures are unremarkable. Mediastinum/Nodes: No mediastinal or hilar adenopathy. No mediastinal fluid. Lungs/Pleura: Lungs are adequately inflated and demonstrate mild dependent posterior bibasilar atelectasis. There are a few small bilateral calcified granulomas. Airways are normal. Musculoskeletal: Subtle fracture of the left posterior fourth rib. Subtle very minimally displaced fracture of the lateral aspect of the left scapula inferior to the glenoid. CT ABDOMEN PELVIS FINDINGS Hepatobiliary: Previous cholecystectomy. Liver and biliary tree are normal. Pancreas: Normal. Spleen: Normal. Adrenals/Urinary Tract: 1.5 cm right adrenal gland and 1.8 cm left adrenal gland likely adenomas. Kidneys are normal in size without hydronephrosis or nephrolithiasis. Ureters and  bladder are normal. Stomach/Bowel: Stomach and small bowel are normal. Appendix not visualized. Mild colonic diverticulosis. Vascular/Lymphatic: Normal. Reproductive: Previous hysterectomy. Other: No free fluid or focal inflammatory change. No free peritoneal air. Tiny umbilical hernia containing only peritoneal fat. Musculoskeletal: Minimally displaced left inferior and superior pubic ramus fractures. Posterior fusion hardware from L1-S1 intact. Mild degenerate change of the spine. Moderate disc disease at the T12-L1 level. IMPRESSION: Subtle fracture of the posterior left fourth rib with very minimally displaced fracture along the lateral aspect of the left scapula inferior to the glenoid. Minimally displaced left superior and inferior pubic rami fractures. No acute solid organ injury. Small bilateral adrenal masses likely adenomas. Mild colonic diverticulosis. Electronically Signed   By: Elberta Fortis M.D.  On: 07/03/2018 07:41   Ct Cervical Spine Wo Contrast  Result Date: 07/03/2018 CLINICAL DATA:  Fall EXAM: CT CERVICAL SPINE WITHOUT CONTRAST TECHNIQUE: Multidetector CT imaging of the cervical spine was performed without intravenous contrast. Multiplanar CT image reconstructions were also generated. COMPARISON:  None. FINDINGS: Alignment: No static subluxation. Facets are aligned. Occipital condyles and the lateral masses of C1 and C2 are normally approximated. Skull base and vertebrae: C4-7 ACDF and C7-T1 posterior fixation without hardware abnormality. No acute fracture. Soft tissues and spinal canal: No prevertebral fluid or swelling. No visible canal hematoma. Disc levels: No advanced spinal canal or neural foraminal stenosis. Upper chest: No pneumothorax, pulmonary nodule or pleural effusion. Other: Normal visualized paraspinal cervical soft tissues. IMPRESSION: No fracture or static subluxation of the cervical spine. Electronically Signed   By: Deatra Robinson M.D.   On: 07/03/2018 05:42   Ct Abdomen  Pelvis W Contrast  Result Date: 07/03/2018 CLINICAL DATA:  Missed step and tripped landing on left side last night. Left wrist, scapula and groin pain. EXAM: CT CHEST, ABDOMEN, AND PELVIS WITH CONTRAST TECHNIQUE: Multidetector CT imaging of the chest, abdomen and pelvis was performed following the standard protocol during bolus administration of intravenous contrast. CONTRAST:  OMNIPAQUE IOHEXOL 300 MG/ML  SOLN COMPARISON:  CT chest 02/26/2013 and CT abdomen/pelvis 09/22/2012 FINDINGS: CT CHEST FINDINGS Cardiovascular: Heart is normal size. Vascular structures are unremarkable. Mediastinum/Nodes: No mediastinal or hilar adenopathy. No mediastinal fluid. Lungs/Pleura: Lungs are adequately inflated and demonstrate mild dependent posterior bibasilar atelectasis. There are a few small bilateral calcified granulomas. Airways are normal. Musculoskeletal: Subtle fracture of the left posterior fourth rib. Subtle very minimally displaced fracture of the lateral aspect of the left scapula inferior to the glenoid. CT ABDOMEN PELVIS FINDINGS Hepatobiliary: Previous cholecystectomy. Liver and biliary tree are normal. Pancreas: Normal. Spleen: Normal. Adrenals/Urinary Tract: 1.5 cm right adrenal gland and 1.8 cm left adrenal gland likely adenomas. Kidneys are normal in size without hydronephrosis or nephrolithiasis. Ureters and bladder are normal. Stomach/Bowel: Stomach and small bowel are normal. Appendix not visualized. Mild colonic diverticulosis. Vascular/Lymphatic: Normal. Reproductive: Previous hysterectomy. Other: No free fluid or focal inflammatory change. No free peritoneal air. Tiny umbilical hernia containing only peritoneal fat. Musculoskeletal: Minimally displaced left inferior and superior pubic ramus fractures. Posterior fusion hardware from L1-S1 intact. Mild degenerate change of the spine. Moderate disc disease at the T12-L1 level. IMPRESSION: Subtle fracture of the posterior left fourth rib with very  minimally displaced fracture along the lateral aspect of the left scapula inferior to the glenoid. Minimally displaced left superior and inferior pubic rami fractures. No acute solid organ injury. Small bilateral adrenal masses likely adenomas. Mild colonic diverticulosis. Electronically Signed   By: Elberta Fortis M.D.   On: 07/03/2018 07:41    Procedures .Splint Application Date/Time: 07/03/2018 6:00 AM Performed by: Dione Booze, MD Authorized by: Dione Booze, MD   Consent:    Consent obtained:  Verbal   Consent given by:  Patient   Risks discussed:  Pain, swelling and numbness   Alternatives discussed:  No treatment Pre-procedure details:    Sensation:  Normal   Skin color:  Normal Procedure details:    Laterality:  Left   Location:  Arm   Arm:  L lower arm   Strapping: no     Splint type:  Sugar tong   Supplies:  Elastic bandage, Ortho-Glass, cotton padding and sling Post-procedure details:    Pain:  Unchanged   Sensation:  Normal  Skin color:  Normal   Patient tolerance of procedure:  Tolerated well, no immediate complications Comments:     Splint placed by orthopedic technician, neurovascular status evaluated by me following splint application.    CRITICAL CARE Performed by: Dione Booze Total critical care time: 45 minutes Critical care time was exclusive of separately billable procedures and treating other patients. Critical care was necessary to treat or prevent imminent or life-threatening deterioration. Critical care was time spent personally by me on the following activities: development of treatment plan with patient and/or surrogate as well as nursing, discussions with consultants, evaluation of patient's response to treatment, examination of patient, obtaining history from patient or surrogate, ordering and performing treatments and interventions, ordering and review of laboratory studies, ordering and review of radiographic studies, pulse oximetry and  re-evaluation of patient's condition.  Medications Ordered in ED Medications  HYDROmorphone (DILAUDID) injection 0.5 mg (has no administration in time range)  ondansetron (ZOFRAN) injection 4 mg (4 mg Intravenous Given 07/03/18 0444)  morphine 4 MG/ML injection 4 mg (4 mg Intravenous Given 07/03/18 0445)  HYDROmorphone (DILAUDID) injection 1 mg (1 mg Intravenous Given 07/03/18 0542)  iohexol (OMNIPAQUE) 300 MG/ML solution 100 mL (100 mLs Intravenous Contrast Given 07/03/18 0658)  HYDROmorphone (DILAUDID) injection 1 mg (1 mg Intravenous Given 07/03/18 0753)     Initial Impression / Assessment and Plan / ED Course  I have reviewed the triage vital signs and the nursing notes.  Pertinent labs & imaging results that were available during my care of the patient were reviewed by me and considered in my medical decision making (see chart for details).  Fall with likely fracture of left wrist.  Pain in multiple other areas, x-rays have been ordered.  She is given morphine for pain.  Wrist x-ray confirms comminuted, intra-articular fracture of the distal left radius.  This is placed in a sugar tong splint.  She did require additional pain medication is been given hydromorphone for pain.  Pelvis x-ray shows fracture of the left hemipelvis.  Scapular fracture shows nondisplaced fracture.  C-spine CT scan shows no acute injury.  X-rays of thoracic spine showed no acute injury.  However, given pelvis fracture and scapular fracture, it was felt that she should have a CT scans to rule out additional injuries.  CT of chest, abdomen, pelvis also demonstrated a nondisplaced fracture of the left fourth rib, but no internal injuries.  She will need to be admitted with likely need for transfer to rehabilitation facility.  Pages have been sent out to orthopedic surgery, hand surgery, trauma surgery.  Final Clinical Impressions(s) / ED Diagnoses   Final diagnoses:  Fall down steps, initial encounter  Other closed  intra-articular fracture of distal end of left radius, initial encounter  Closed nondisplaced fracture of left ilium, unspecified fracture morphology, initial encounter (HCC)  Closed fracture of one rib of left side, initial encounter    ED Discharge Orders    None       Dione Booze, MD 07/03/18 [phone]

## 2018-07-03 NOTE — ED Notes (Signed)
Attempted report x1. 

## 2018-07-03 NOTE — Progress Notes (Signed)
Orthopedic Tech Progress Note Patient Details:  Rachel Allison [birth year] [id number]  Patient ID: [name], female   DOB: 26-Feb-1956, 62 y.o.   MRN: [id number]   [name] 07/03/2018, 11:09 AM As ordered by PA Charma Igo

## 2018-07-04 ENCOUNTER — Inpatient Hospital Stay (HOSPITAL_COMMUNITY): Payer: Medicare Other | Admitting: Anesthesiology

## 2018-07-04 ENCOUNTER — Inpatient Hospital Stay (HOSPITAL_COMMUNITY): Payer: Medicare Other

## 2018-07-04 ENCOUNTER — Encounter (HOSPITAL_COMMUNITY): Payer: Self-pay | Admitting: Anesthesiology

## 2018-07-04 ENCOUNTER — Encounter (HOSPITAL_COMMUNITY)
Admission: EM | Disposition: A | Discharge status: Home / Self Care | Payer: Self-pay | Source: Home / Self Care | Attending: Emergency Medicine

## 2018-07-04 DIAGNOSIS — S52302A Unspecified fracture of shaft of left radius, initial encounter for closed fracture: Secondary | ICD-10-CM | POA: Diagnosis not present

## 2018-07-04 DIAGNOSIS — S52572A Other intraarticular fracture of lower end of left radius, initial encounter for closed fracture: Secondary | ICD-10-CM | POA: Diagnosis not present

## 2018-07-04 DIAGNOSIS — S42192B Fracture of other part of scapula, left shoulder, initial encounter for open fracture: Secondary | ICD-10-CM | POA: Diagnosis not present

## 2018-07-04 DIAGNOSIS — S2232XA Fracture of one rib, left side, initial encounter for closed fracture: Secondary | ICD-10-CM | POA: Diagnosis not present

## 2018-07-04 HISTORY — PX: OPEN REDUCTION INTERNAL FIXATION (ORIF) DISTAL RADIAL FRACTURE: SHX5989

## 2018-07-04 LAB — HIV ANTIBODY (ROUTINE TESTING W REFLEX): HIV Screen 4th Generation wRfx: NONREACTIVE

## 2018-07-04 SURGERY — OPEN REDUCTION INTERNAL FIXATION (ORIF) DISTAL RADIUS FRACTURE
Anesthesia: General | Site: Wrist | Laterality: Left

## 2018-07-04 MED ORDER — ONDANSETRON HCL 4 MG/2ML IJ SOLN
INTRAMUSCULAR | Status: DC | PRN
  Administered 2018-07-04: 4 mg via INTRAVENOUS

## 2018-07-04 MED ORDER — CEFAZOLIN SODIUM-DEXTROSE 2-4 GM/100ML-% IV SOLN
2.0000 g | Freq: Three times a day (TID) | INTRAVENOUS | Status: AC
  Administered 2018-07-04 – 2018-07-05 (×3): 2 g via INTRAVENOUS
  Filled 2018-07-04 (×3): qty 100

## 2018-07-04 MED ORDER — FENTANYL CITRATE (PF) 250 MCG/5ML IJ SOLN
INTRAMUSCULAR | Status: DC | PRN
  Administered 2018-07-04 (×2): 50 ug via INTRAVENOUS

## 2018-07-04 MED ORDER — DEXAMETHASONE SODIUM PHOSPHATE 4 MG/ML IJ SOLN
INTRAMUSCULAR | Status: DC | PRN
  Administered 2018-07-04: 10 mg via INTRAVENOUS

## 2018-07-04 MED ORDER — PHENYLEPHRINE HCL 10 MG/ML IJ SOLN
INTRAMUSCULAR | Status: DC | PRN
  Administered 2018-07-04: 80 ug via INTRAVENOUS
  Administered 2018-07-04: 40 ug via INTRAVENOUS

## 2018-07-04 MED ORDER — LIDOCAINE 2% (20 MG/ML) 5 ML SYRINGE
INTRAMUSCULAR | Status: DC | PRN
  Administered 2018-07-04: 100 mg via INTRAVENOUS

## 2018-07-04 MED ORDER — FENTANYL CITRATE (PF) 100 MCG/2ML IJ SOLN
INTRAMUSCULAR | Status: DC | PRN
  Administered 2018-07-04: 50 ug via INTRAVENOUS

## 2018-07-04 MED ORDER — EPHEDRINE 5 MG/ML INJ
INTRAVENOUS | Status: AC
  Filled 2018-07-04: qty 10

## 2018-07-04 MED ORDER — FENTANYL CITRATE (PF) 250 MCG/5ML IJ SOLN: INTRAMUSCULAR | Status: DC | PRN

## 2018-07-04 MED ORDER — FENTANYL CITRATE (PF) 250 MCG/5ML IJ SOLN
INTRAMUSCULAR | Status: AC
  Filled 2018-07-04: qty 5

## 2018-07-04 MED ORDER — PROPOFOL 10 MG/ML IV BOLUS
INTRAVENOUS | Status: DC | PRN
  Administered 2018-07-04: 150 mg via INTRAVENOUS

## 2018-07-04 MED ORDER — MIDAZOLAM HCL 2 MG/2ML IJ SOLN
INTRAMUSCULAR | Status: AC
  Filled 2018-07-04: qty 2

## 2018-07-04 MED ORDER — DEXAMETHASONE SODIUM PHOSPHATE 10 MG/ML IJ SOLN
INTRAMUSCULAR | Status: AC
  Filled 2018-07-04: qty 1

## 2018-07-04 MED ORDER — 0.9 % SODIUM CHLORIDE (POUR BTL) OPTIME
TOPICAL | Status: DC | PRN
  Administered 2018-07-04: 1000 mL

## 2018-07-04 MED ORDER — LACTATED RINGERS IV SOLN
INTRAVENOUS | Status: DC
  Administered 2018-07-04 (×3): via INTRAVENOUS

## 2018-07-04 MED ORDER — SUCCINYLCHOLINE CHLORIDE 200 MG/10ML IV SOSY
PREFILLED_SYRINGE | INTRAVENOUS | Status: AC
  Filled 2018-07-04: qty 10

## 2018-07-04 MED ORDER — LIDOCAINE 2% (20 MG/ML) 5 ML SYRINGE
INTRAMUSCULAR | Status: AC
  Filled 2018-07-04: qty 5

## 2018-07-04 MED ORDER — MIDAZOLAM HCL 5 MG/5ML IJ SOLN
INTRAMUSCULAR | Status: DC | PRN
  Administered 2018-07-04: 2 mg via INTRAVENOUS

## 2018-07-04 MED ORDER — FENTANYL CITRATE (PF) 100 MCG/2ML IJ SOLN
INTRAMUSCULAR | Status: AC
  Filled 2018-07-04: qty 2

## 2018-07-04 MED ORDER — PHENYLEPHRINE 40 MCG/ML (10ML) SYRINGE FOR IV PUSH (FOR BLOOD PRESSURE SUPPORT)
PREFILLED_SYRINGE | INTRAVENOUS | Status: AC
  Filled 2018-07-04: qty 10

## 2018-07-04 MED ORDER — ROCURONIUM BROMIDE 100 MG/10ML IV SOLN
INTRAVENOUS | Status: DC | PRN
  Administered 2018-07-04: 50 mg via INTRAVENOUS

## 2018-07-04 MED ORDER — EPHEDRINE SULFATE 50 MG/ML IJ SOLN
INTRAMUSCULAR | Status: DC | PRN
  Administered 2018-07-04 (×2): 5 mg via INTRAVENOUS
  Administered 2018-07-04: 10 mg via INTRAVENOUS

## 2018-07-04 MED ORDER — ROCURONIUM BROMIDE 50 MG/5ML IV SOSY
PREFILLED_SYRINGE | INTRAVENOUS | Status: AC
  Filled 2018-07-04: qty 5

## 2018-07-04 MED ORDER — SUCCINYLCHOLINE CHLORIDE 20 MG/ML IJ SOLN
INTRAMUSCULAR | Status: DC | PRN
  Administered 2018-07-04: 100 mg via INTRAVENOUS

## 2018-07-04 MED ORDER — PROPOFOL 10 MG/ML IV BOLUS
INTRAVENOUS | Status: AC
  Filled 2018-07-04: qty 20

## 2018-07-04 MED ORDER — SUGAMMADEX SODIUM 200 MG/2ML IV SOLN
INTRAVENOUS | Status: DC | PRN
  Administered 2018-07-04: 200 mg via INTRAVENOUS

## 2018-07-04 MED ORDER — ONDANSETRON HCL 4 MG/2ML IJ SOLN
INTRAMUSCULAR | Status: AC
  Filled 2018-07-04: qty 2

## 2018-07-04 SURGICAL SUPPLY — 69 items
BANDAGE ACE 3X5.8 VEL STRL LF (GAUZE/BANDAGES/DRESSINGS) ×2 IMPLANT
BANDAGE ACE 4X5 VEL STRL LF (GAUZE/BANDAGES/DRESSINGS) ×3 IMPLANT
BIT DRILL 2.2 SS TIBIAL (BIT) ×2 IMPLANT
BLADE CLIPPER SURG (BLADE) ×3 IMPLANT
BNDG CMPR 9X4 STRL LF SNTH (GAUZE/BANDAGES/DRESSINGS) ×1
BNDG ESMARK 4X9 LF (GAUZE/BANDAGES/DRESSINGS) ×3 IMPLANT
BRUSH SCRUB SURG 4.25 DISP (MISCELLANEOUS) ×6 IMPLANT
CORD BIPOLAR FORCEPS 12FT (ELECTRODE) ×2 IMPLANT
COVER SURGICAL LIGHT HANDLE (MISCELLANEOUS) ×3 IMPLANT
COVER WAND RF STERILE (DRAPES) ×3 IMPLANT
CUFF TOURNIQUET SINGLE 18IN (TOURNIQUET CUFF) ×2 IMPLANT
DECANTER SPIKE VIAL GLASS SM (MISCELLANEOUS) IMPLANT
DRAPE C-ARM 42X72 X-RAY (DRAPES) ×3 IMPLANT
DRAPE SURG 17X23 STRL (DRAPES) ×2 IMPLANT
DRSG ADAPTIC 3X8 NADH LF (GAUZE/BANDAGES/DRESSINGS) ×2 IMPLANT
DRSG EMULSION OIL 3X3 NADH (GAUZE/BANDAGES/DRESSINGS) ×3 IMPLANT
DRSG MEPITEL 4X7.2 (GAUZE/BANDAGES/DRESSINGS) ×2 IMPLANT
ELECT REM PT RETURN 9FT ADLT (ELECTROSURGICAL) ×3
ELECTRODE REM PT RTRN 9FT ADLT (ELECTROSURGICAL) ×1 IMPLANT
GAUZE SPONGE 4X4 12PLY STRL (GAUZE/BANDAGES/DRESSINGS) ×3 IMPLANT
GLOVE BIO SURGEON STRL SZ7.5 (GLOVE) ×3 IMPLANT
GLOVE BIO SURGEON STRL SZ8 (GLOVE) ×3 IMPLANT
GLOVE BIOGEL PI IND STRL 7.5 (GLOVE) ×1 IMPLANT
GLOVE BIOGEL PI IND STRL 8 (GLOVE) ×1 IMPLANT
GLOVE BIOGEL PI INDICATOR 7.5 (GLOVE) ×2
GLOVE BIOGEL PI INDICATOR 8 (GLOVE) ×2
GLOVE SURG SS PI 7.0 STRL IVOR (GLOVE) ×2 IMPLANT
GOWN STRL REUS W/ TWL LRG LVL3 (GOWN DISPOSABLE) ×2 IMPLANT
GOWN STRL REUS W/ TWL XL LVL3 (GOWN DISPOSABLE) ×1 IMPLANT
GOWN STRL REUS W/TWL LRG LVL3 (GOWN DISPOSABLE) ×6
GOWN STRL REUS W/TWL XL LVL3 (GOWN DISPOSABLE) ×3
K-WIRE 1.6 (WIRE) ×9
K-WIRE FX5X1.6XNS BN SS (WIRE) ×3
KIT BASIN OR (CUSTOM PROCEDURE TRAY) ×3 IMPLANT
KIT TURNOVER KIT B (KITS) ×3 IMPLANT
KWIRE FX5X1.6XNS BN SS (WIRE) IMPLANT
LAWSON 3822 0.045" k wire ×2 IMPLANT
NEEDLE HYPO 25GX1X1/2 BEV (NEEDLE) IMPLANT
NS IRRIG 1000ML POUR BTL (IV SOLUTION) ×3 IMPLANT
PACK ORTHO EXTREMITY (CUSTOM PROCEDURE TRAY) ×3 IMPLANT
PAD ARMBOARD 7.5X6 YLW CONV (MISCELLANEOUS) ×6 IMPLANT
PAD CAST 3X4 CTTN HI CHSV (CAST SUPPLIES) ×1 IMPLANT
PAD CAST 4YDX4 CTTN HI CHSV (CAST SUPPLIES) IMPLANT
PADDING CAST COTTON 3X4 STRL (CAST SUPPLIES) ×3
PADDING CAST COTTON 4X4 STRL (CAST SUPPLIES) ×3
PEG LOCKING SMOOTH 2.2X18 (Peg) ×4 IMPLANT
PEG LOCKING SMOOTH 2.2X22 (Screw) ×3 IMPLANT
PEG LOCKING SMOOTH 2.2X24 (Peg) ×2 IMPLANT
PEG LOCKING SMOOTH 2.2X26 (Peg) ×2 IMPLANT
PLATE LONG DVR LEFT (Plate) ×2 IMPLANT
SCREW LOCK 14X2.7X 3 LD TPR (Screw) IMPLANT
SCREW LOCKING 2.7X14 (Screw) ×12 IMPLANT
SCREW LOCKING 2.7X15MM (Screw) ×2 IMPLANT
SCREW MULTI DIRECTIONAL 2.7X20 (Screw) ×4 IMPLANT
SUCTION FRAZIER HANDLE 10FR (MISCELLANEOUS) ×2
SUCTION TUBE FRAZIER 10FR DISP (MISCELLANEOUS) IMPLANT
SUT ETHILON 3 0 PS 1 (SUTURE) ×6 IMPLANT
SUT VIC AB 0 CT1 27 (SUTURE) ×3
SUT VIC AB 0 CT1 27XBRD ANBCTR (SUTURE) ×2 IMPLANT
SUT VIC AB 1 CTX 36 (SUTURE) ×3
SUT VIC AB 1 CTX36XBRD ANBCTR (SUTURE) IMPLANT
SUT VIC AB 2-0 CT1 27 (SUTURE) ×3
SUT VIC AB 2-0 CT1 TAPERPNT 27 (SUTURE) ×1 IMPLANT
SYR CONTROL 10ML LL (SYRINGE) IMPLANT
TOWEL OR 17X24 6PK STRL BLUE (TOWEL DISPOSABLE) ×3 IMPLANT
TOWEL OR 17X26 10 PK STRL BLUE (TOWEL DISPOSABLE) ×6 IMPLANT
TUBE CONNECTING 12'X1/4 (SUCTIONS) ×1
TUBE CONNECTING 12X1/4 (SUCTIONS) ×2 IMPLANT
UNDERPAD 30X30 (UNDERPADS AND DIAPERS) ×3 IMPLANT

## 2018-07-04 NOTE — Evaluation (Signed)
Physical Therapy Evaluation Patient Details Name: Rachel Allison MRN: [id number] DOB: 07/14/1956 Today's Date: 07/04/2018   History of Present Illness  62 yo female with a history of HTN, GERD, cervical and lumbar disc fusion, fibromyalgia, who presented to the ED after a fall. Pt lives in a 83ft camper and was going to the bathroom this am when she fell down 3 steps. She did not hit the steps and landed on her left side. She states no LOC. Dizziness after the fall. Imaging revealed L wrist, L scapula, L rib and L pelvic fxs. Pt to have ORIF on L wrist 06/03/18  Clinical Impression  PTA pt independent with all mobility and ADLs living in a camper with 3 steps to enter. Pt currently limited in safe mobility by increased pain with weightbearing through L LE and decreased use of L UE, due to multiple fx. Pt requires minA for bed mobility and modA for stand pivot transfers to/from [facility]. Pt is very independent and motivated to return to her PLOF and would easily be able to tolerate 3 hours of therapy a day. Pt also has family support and ability to live with daughter in handicap accessible apartment after d/c. Given pt's rehab potential PT recommends CIR level therapy at d/c. PT will continue to follow acutely.     Follow Up Recommendations CIR    Equipment Recommendations  Other (comment)(TBD)    Recommendations for Other Services       Precautions / Restrictions Precautions Precautions: Fall Precaution Comments: s/p fall down steps Required Braces or Orthoses: Sling;Other Brace/Splint(L wrist splinted, sling in room not on pt on entry) Restrictions Weight Bearing Restrictions: Yes LUE Weight Bearing: Non weight bearing(wrist NWB, scapula WBAT) LLE Weight Bearing: Weight bearing as tolerated      Mobility  Bed Mobility Overal bed mobility: Needs Assistance Bed Mobility: Supine to Sit;Sit to Supine     Supine to sit: Min assist;HOB elevated Sit to supine: Min guard;HOB elevated    General bed mobility comments: minA for management of trunk into upright and pad scoot of hips to EoB, min guard for safety with management of LE into bed, pt able to utilize R bed rail to pull with RUE to bring hips into bed  Transfers Overall transfer level: Needs assistance Equipment used: 1 person hand held assist Transfers: Stand Pivot Transfers   Stand pivot transfers: Mod assist       General transfer comment: modA for stand pivot to<>from BSC to provide support to off weight R LE to pivot on ball of foot   Ambulation/Gait             General Gait Details: unable to attempt        Balance Overall balance assessment: Needs assistance Sitting-balance support: Single extremity supported;Feet supported Sitting balance-[name]: Fair     Standing balance support: Single extremity supported Standing balance-[name]: Poor                               Pertinent Vitals/Pain Pain Assessment: 0-10 Pain Score: 10-Worst pain ever Pain Location: wrist and pelvis with movement. resting pain 5/10 Pain Descriptors / Indicators: Sharp;Shooting;Aching;Constant Pain Intervention(s): Limited activity within patient's tolerance;Monitored during session;Repositioned    Home Living Family/patient expects to be discharged to:: Skilled nursing facility Living Arrangements: Children Available Help at Discharge: Family;Available PRN/intermittently Type of Home: Apartment Home Access: Level entry     Home Layout: One level  Home Equipment: None      Prior Function Level of Independence: Independent                  Extremity/Trunk Assessment   Upper Extremity Assessment Upper Extremity Assessment: LUE deficits/detail LUE Deficits / Details: L wrist splinted, movement limited by pain     Lower Extremity Assessment Lower Extremity Assessment: LLE deficits/detail LLE Deficits / Details: deep groin pain with movement, unable to bear weight due to  pain  LLE: Unable to fully assess due to pain       Communication   Communication: No difficulties  Cognition Arousal/Alertness: Awake/alert Behavior During Therapy: WFL for tasks assessed/performed Overall Cognitive Status: Within Functional Limits for tasks assessed                                        General Comments General comments (skin integrity, edema, etc.): Daughter present in room, states pt could live with her in handicapped accessible appartment if she had a hospital bed. Pt with increased swelling in L hand         Assessment/Plan    PT Assessment Patient needs continued PT services  PT Problem List Decreased activity tolerance;Decreased balance;Decreased mobility;Pain       PT Treatment Interventions DME instruction;Gait training;Stair training;Functional mobility training;Therapeutic activities;Therapeutic exercise;Balance training;Patient/family education    PT Goals (Current goals can be found in the Care Plan section)  Acute Rehab PT Goals Patient Stated Goal: be independent PT Goal Formulation: With patient/family Time For Goal Achievement: 07/18/18 Potential to Achieve Goals: Good    Frequency Min 3X/week    AM-PAC PT "6 Clicks" Daily Activity  Outcome Measure Difficulty turning over in bed (including adjusting bedclothes, sheets and blankets)?: Unable Difficulty moving from lying on back to sitting on the side of the bed? : Unable Difficulty sitting down on and standing up from a chair with arms (e.g., wheelchair, bedside commode, etc,.)?: Unable Help needed moving to and from a bed to chair (including a wheelchair)?: A Lot Help needed walking in hospital room?: Total Help needed climbing 3-5 steps with a railing? : Total 6 Click Score: 7    End of Session Equipment Utilized During Treatment: Gait belt Activity Tolerance: Patient limited by pain Patient left: in bed;with nursing/sitter in room(ready for transport to OR  ) Nurse Communication: Mobility status;Weight bearing status PT Visit Diagnosis: Unsteadiness on feet (R26.81);Other abnormalities of gait and mobility (R26.89);History of falling (Z91.81);Difficulty in walking, not elsewhere classified (R26.2);Pain Pain - Right/Left: Left Pain - part of body: Shoulder;Hand;Hip    Time: 1610-9604 PT Time Calculation (min) (ACUTE ONLY): 33 min   Charges:   PT Evaluation $PT Eval Moderate Complexity: 1 Mod PT Treatments $Therapeutic Activity: 8-22 mins        Attikus Bartoszek B. Beverely Risen PT, DPT Acute Rehabilitation Services Pager [phone] Office [phone]   Elon Alas Fleet 07/04/2018, 12:31 PM

## 2018-07-04 NOTE — Anesthesia Postprocedure Evaluation (Signed)
Anesthesia Post Note  Patient: HANIYA FERN  Procedure(s) Performed: OPEN REDUCTION INTERNAL FIXATION (ORIF) DISTAL RADIAL FRACTURE (Left Wrist)     Patient location during evaluation: PACU Anesthesia Type: General Level of consciousness: awake and alert Pain management: pain level controlled Vital Signs Assessment: post-procedure vital signs reviewed and stable Respiratory status: spontaneous breathing, nonlabored ventilation, respiratory function stable and patient connected to nasal cannula oxygen Cardiovascular status: blood pressure returned to baseline and stable Postop Assessment: no apparent nausea or vomiting Anesthetic complications: no    Last Vitals:  Vitals:   07/04/18 1040 07/04/18 1330  BP: 118/63   Pulse: 68   Resp: 20   Temp: 36.8 C (!) 36.3 C  SpO2: 92%     Last Pain:  Vitals:   07/04/18 1330  TempSrc:   PainSc: Asleep                 Ulys Favia,W. EDMOND

## 2018-07-04 NOTE — Progress Notes (Signed)
Occupational Therapy Evaluation Patient Details Name: Rachel Allison MRN: [id number] DOB: 01-15-1956 Today's Date: 07/04/2018    History of Present Illness 62 yo female with a history of HTN, GERD, cervical and lumbar disc fusion, fibromyalgia, who presented to the ED after a fall. Pt lives in a 45ft camper and was going to the bathroom this am when she fell down 3 steps. She did not hit the steps and landed on her left side. She states no LOC. Dizziness after the fall. Imaging revealed L wrist, L scapula, L rib and L pelvic fxs.  ORIF on L wrist 06/03/18   Clinical Impression   PTA pt lived in a camper and was independent with ADL and mobility. Pt making excellent progress. Able to complete stand pivot transfer to toilet with manipulation of clothing with min A. Pt discussed DC options of going to one of her children's homes vs rehab. At this time recommend short term rehab at [facility] to facilitate safe DC home. Will need clarification if pt can use platform RW to mobilize. Educated pt on importance of keeping LUE elevated with use of ice and frequent movement of digits to decrease edema and increase ROM. Will follow acutely.     Follow Up Recommendations  CIR;Supervision - Intermittent    Equipment Recommendations  3 in 1 bedside commode;Tub/shower bench    Recommendations for Other Services Rehab consult     Precautions / Restrictions Precautions Precautions: Fall Required Braces or Orthoses: Sling;Other Brace/Splint(L wrist splinted, sling for comfort) Restrictions Weight Bearing Restrictions: Yes LUE Weight Bearing: Non weight bearing(wrist NWB, scapula WBAT) LLE Weight Bearing: Weight bearing as tolerated      Mobility Bed Mobility Overal bed mobility: Needs Assistance Bed Mobility: Supine to Sit;Sit to Supine     Supine to sit: Min assist;HOB elevated Sit to supine: HOB elevated;Min assist   General bed mobility comments: sit - sidelying to reduce pelvic pain during  mobility  Transfers Overall transfer level: Needs assistance Equipment used: 1 person hand held assist Transfers: Stand Pivot Transfers   Stand pivot transfers: Min assist            Balance Overall balance assessment: Needs assistance Sitting-balance support: Single extremity supported;Feet supported Sitting balance-[name]: Good     Standing balance support: Single extremity supported Standing balance-[name]: Poor                             ADL either performed or assessed with clinical judgement   ADL Overall ADL's : Needs assistance/impaired     Grooming: Minimal assistance   Upper Body Bathing: Minimal assistance;Sitting   Lower Body Bathing: Moderate assistance;Sit to/from stand   Upper Body Dressing : Minimal assistance;Sitting   Lower Body Dressing: Moderate assistance;Sit to/from stand   Toilet Transfer: Minimal assistance;Stand-pivot;BSC   Toileting- Clothing Manipulation and Hygiene: Set up;Supervision/safety;Sitting/lateral lean;Sit to/from stand       Functional mobility during ADLs: Minimal assistance       Vision         Perception     Praxis      Pertinent Vitals/Pain Pain Assessment: 0-10 Pain Score: 3  Pain Location: L leg/groin Pain Descriptors / Indicators: Sharp;Shooting;Aching;Constant Pain Intervention(s): Limited activity within patient's tolerance     Hand Dominance Right   Extremity/Trunk Assessment Upper Extremity Assessment Upper Extremity Assessment: LUE deficits/detail LUE Deficits / Details: L wrist splinted, movement limited by pain; moving L shoudler - encouraged ROM  as tolerated LUE Coordination: decreased fine motor   Lower Extremity Assessment Lower Extremity Assessment: Defer to PT evaluation LLE Deficits / Details: deep groin pain with movement, unable to bear weight due to pain  LLE: Unable to fully assess due to pain   Cervical / Trunk Assessment Cervical / Trunk Assessment:  Normal   Communication Communication Communication: No difficulties   Cognition Arousal/Alertness: Awake/alert Behavior During Therapy: WFL for tasks assessed/performed Overall Cognitive Status: Within Functional Limits for tasks assessed                                     General Comments       Exercises Exercises: Other exercises Other Exercises Other Exercises: incentive spirometer x 10 Other Exercises: encouraged digit ROM L hand Other Exercises: ice elevation and edema control   Shoulder Instructions      Home Living Family/patient expects to be discharged to:: Private residence Living Arrangements: Children Available Help at Discharge: Family;Available PRN/intermittently Type of Home: Apartment Home Access: Level entry     Home Layout: One level     Bathroom Shower/Tub: Chief Strategy Officer: Handicapped height Bathroom Accessibility: Yes How Accessible: Accessible via wheelchair Home Equipment: None   Additional Comments: This is daughter's home; Pt lives in [place]      Prior Functioning/Environment Level of Independence: Independent                 OT Problem List: Decreased strength;Decreased range of motion;Decreased activity tolerance;Impaired balance (sitting and/or standing);Decreased knowledge of use of DME or AE;Impaired UE functional use;Pain      OT Treatment/Interventions: Self-care/ADL training;Therapeutic exercise;DME and/or AE instruction;Therapeutic activities;Patient/family education;Balance training    OT Goals(Current goals can be found in the care plan section) Acute Rehab OT Goals Patient Stated Goal: be independent OT Goal Formulation: With patient Time For Goal Achievement: 07/18/18 Potential to Achieve Goals: Good  OT Frequency: Min 3X/week   Barriers to D/C:            Co-evaluation              AM-PAC PT "6 Clicks" Daily Activity     Outcome Measure Help from another person  eating meals?: None Help from another person taking care of personal grooming?: A Little Help from another person toileting, which includes using toliet, bedpan, or urinal?: A Little Help from another person bathing (including washing, rinsing, drying)?: A Little Help from another person to put on and taking off regular upper body clothing?: A Little Help from another person to put on and taking off regular lower body clothing?: A Lot 6 Click Score: 18   End of Session Equipment Utilized During Treatment: Gait belt Nurse Communication: Mobility status  Activity Tolerance: Patient tolerated treatment well Patient left: in bed;with call bell/phone within reach;with bed alarm set  OT Visit Diagnosis: Other abnormalities of gait and mobility (R26.89);Muscle weakness (generalized) (M62.81);History of falling (Z91.81);Pain Pain - Right/Left: Left Pain - part of body: Leg                Time: 4540-9811 OT Time Calculation (min): 34 min Charges:  OT General Charges $OT Visit: 1 Visit OT Evaluation $OT Eval Moderate Complexity: 1 Mod OT Treatments $Self Care/Home Management : 8-22 mins  Luisa Dago, OT/L   Acute OT Clinical Specialist Acute Rehabilitation Services Pager [phone] Office [phone]   [facility] 07/04/2018, 6:37 PM

## 2018-07-04 NOTE — Progress Notes (Signed)
Report called to Bonnie in short stay.  

## 2018-07-04 NOTE — Anesthesia Procedure Notes (Signed)
Procedure Name: Intubation Date/Time: 07/04/2018 11:55 AM Performed by: Fransisca Kaufmann, CRNA Pre-anesthesia Checklist: Patient identified, Emergency Drugs available, Suction available and Patient being monitored Patient Re-evaluated:Patient Re-evaluated prior to induction Oxygen Delivery Method: Circle System Utilized Preoxygenation: Pre-oxygenation with 100% oxygen Induction Type: IV induction Ventilation: Mask ventilation without difficulty Laryngoscope Size: Miller and 2 Grade View: Grade I Tube type: Oral Tube size: 7.0 mm Number of attempts: 1 Airway Equipment and Method: Stylet and Oral airway Placement Confirmation: ETT inserted through vocal cords under direct vision,  positive ETCO2 and breath sounds checked- equal and bilateral Secured at: 21 cm Tube secured with: Tape Dental Injury: Teeth and Oropharynx as per pre-operative assessment

## 2018-07-04 NOTE — Transfer of Care (Signed)
Immediate Anesthesia Transfer of Care Note  Patient: Rachel Allison  Procedure(s) Performed: OPEN REDUCTION INTERNAL FIXATION (ORIF) DISTAL RADIAL FRACTURE (Left Wrist)  Patient Location: PACU  Anesthesia Type:General  Level of Consciousness: awake, alert , oriented and sedated  Airway & Oxygen Therapy: Patient Spontanous Breathing and Patient connected to nasal cannula oxygen  Post-op Assessment: Report given to RN, Post -op Vital signs reviewed and stable and Patient moving all extremities  Post vital signs: Reviewed and stable  Last Vitals:  Vitals Value Taken Time  BP 120/79 07/04/2018  1:28 PM  Temp    Pulse 79 07/04/2018  1:31 PM  Resp 13 07/04/2018  1:31 PM  SpO2 91 % 07/04/2018  1:31 PM  Vitals shown include unvalidated device data.  Last Pain:  Vitals:   07/04/18 1040  TempSrc: Oral  PainSc:       Patients Stated Pain Goal: 0 (07/04/18 0856)  Complications: No apparent anesthesia complications

## 2018-07-04 NOTE — Progress Notes (Signed)
Subjective: Patient c/o pain in left wrist.  This is greatest source of pain.  Some pain in her pelvis as well.  Going to OR today for ORIF of wrist.  Objective: Vital signs in last 24 hours: Temp:  [98 F (36.7 C)-98.7 F (37.1 C)] 98.7 F (37.1 C) (11/05 0405) Pulse Rate:  [64-77] 69 (11/05 0405) Resp:  [11-18] 18 (11/04 2143) BP: (102-126)/(63-73) 125/73 (11/05 0405) SpO2:  [90 %-98 %] 94 % (11/05 0405) Weight:  [86 kg] 86 kg (11/04 0903) Last BM Date: 07/02/18  Intake/Output from previous day: 11/04 0701 - 11/05 0700 In: 240 [P.O.:240] Out: 700 [Urine:700] Intake/Output this shift: No intake/output data recorded.  PE: Gen: laying in bed in NAD Heart: regular Lungs: CTAB, no pain with deep inspiration Abd: soft, NT, ND, +BS Ext: deep pelvic pain from fx, LUE with splint in place.  Fingers are quite swollen and difficult to move secondary to swelling.  Has normal sensation.  +2 pedal pulses.  NVI  Lab Results:  Recent Labs    07/03/18 0551  WBC 15.0*  HGB 12.5  HCT 39.5  PLT 268   BMET Recent Labs    07/03/18 0551  NA 136  K 4.2  CL 108  CO2 23  GLUCOSE 115*  BUN 14  CREATININE 0.76  CALCIUM 9.1   PT/INR No results for input(s): LABPROT, INR in the last 72 hours. CMP     Component Value Date/Time   NA 136 07/03/2018 0551   K 4.2 07/03/2018 0551   CL 108 07/03/2018 0551   CO2 23 07/03/2018 0551   GLUCOSE 115 (H) 07/03/2018 0551   BUN 14 07/03/2018 0551   CREATININE 0.76 07/03/2018 0551   CALCIUM 9.1 07/03/2018 0551   GFRNONAA >60 07/03/2018 0551   GFRAA >60 07/03/2018 0551   Lipase  No results found for: LIPASE     Studies/Results: Dg Thoracic Spine W/swimmers  Result Date: 07/03/2018 CLINICAL DATA:  Fall EXAM: THORACIC SPINE - 3 VIEWS COMPARISON:  None. FINDINGS: There is no evidence of thoracic spine fracture. Alignment is normal. No other significant bone abnormalities are identified. There is lower cervical and lower thoracic  fusion hardware. IMPRESSION: Negative. Electronically Signed   By: Deatra Robinson M.D.   On: 07/03/2018 05:29   Dg Pelvis 1-2 Views  Result Date: 07/03/2018 CLINICAL DATA:  Fall EXAM: PELVIS - 1-2 VIEW COMPARISON:  None. FINDINGS: There are minimally displaced fractures of the left superior and inferior pubic rami. No fracture of the left hip. IMPRESSION: Minimally displaced fractures of the left inferior and superior pubic rami. Electronically Signed   By: Deatra Robinson M.D.   On: 07/03/2018 05:25   Dg Scapula Left  Result Date: 07/03/2018 CLINICAL DATA:  Fall EXAM: LEFT SCAPULA - 2+ VIEWS COMPARISON:  None. FINDINGS: There is a nondisplaced fracture at the superior border of the scapula. No intra-articular extension is visualized. Glenohumeral joint remains approximated. IMPRESSION: Nondisplaced fracture at the superior border of the scapula. Electronically Signed   By: Deatra Robinson M.D.   On: 07/03/2018 05:27   Dg Wrist 2 Views Left  Result Date: 07/03/2018 CLINICAL DATA:  Fracture EXAM: LEFT WRIST - 2 VIEW COMPARISON:  07/03/2018 FINDINGS: Two views study has fine bony detail obscured by overlying fiberglass splint. Comminuted fracture of the distal radius shows interval improvement in bony alignment, status post close reduction. No evidence for associated fracture of the distal ulna. Degenerative changes are noted in the  first carpometacarpal joint. IMPRESSION: Improved alignment status post closed reduction of severely comminuted distal radius fracture. Electronically Signed   By: Kennith Center M.D.   On: 07/03/2018 12:41   Dg Wrist Complete Left  Result Date: 07/03/2018 CLINICAL DATA:  Fall EXAM: LEFT WRIST - COMPLETE 3+ VIEW COMPARISON:  None. FINDINGS: Comminuted fracture of the distal left radius with dorsal angulation and displacement. Fracture involves the articular surface of the radius there is a major oblique fracture line extending approximately 4.5 cm proximally into the humeral  shaft. No ulna fracture. 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 IMPRESSION: Comminuted fracture of the distal left radius with dorsal angulation and displacement. Electronically Signed   By: Deatra Robinson M.D.   On: 07/03/2018 05:30   Ct Chest W Contrast  Result Date: 07/03/2018 CLINICAL DATA:  Missed step and tripped landing on left side last night. Left wrist, scapula and groin pain. EXAM: CT CHEST, ABDOMEN, AND PELVIS WITH CONTRAST TECHNIQUE: Multidetector CT imaging of the chest, abdomen and pelvis was performed following the standard protocol during bolus administration of intravenous contrast. CONTRAST:  OMNIPAQUE IOHEXOL 300 MG/ML  SOLN COMPARISON:  CT chest 02/26/2013 and CT abdomen/pelvis 09/22/2012 FINDINGS: CT CHEST FINDINGS Cardiovascular: Heart is normal size. Vascular structures are unremarkable. Mediastinum/Nodes: No mediastinal or hilar adenopathy. No mediastinal fluid. Lungs/Pleura: Lungs are adequately inflated and demonstrate mild dependent posterior bibasilar atelectasis. There are a few small bilateral calcified granulomas. Airways are normal. Musculoskeletal: Subtle fracture of the left posterior fourth rib. Subtle very minimally displaced fracture of the lateral aspect of the left scapula inferior to the glenoid. CT ABDOMEN PELVIS FINDINGS Hepatobiliary: Previous cholecystectomy. Liver and biliary tree are normal. Pancreas: Normal. Spleen: Normal. Adrenals/Urinary Tract: 1.5 cm right adrenal gland and 1.8 cm left adrenal gland likely adenomas. Kidneys are normal in size without hydronephrosis or nephrolithiasis. Ureters and bladder are normal. Stomach/Bowel: Stomach and small bowel are normal. Appendix not visualized. Mild colonic diverticulosis. Vascular/Lymphatic: Normal. Reproductive: Previous hysterectomy. Other: No free fluid or focal inflammatory change. No free peritoneal air. Tiny umbilical hernia containing only peritoneal fat. Musculoskeletal:  Minimally displaced left inferior and superior pubic ramus fractures. Posterior fusion hardware from L1-S1 intact. Mild degenerate change of the spine. Moderate disc disease at the T12-L1 level. IMPRESSION: Subtle fracture of the posterior left fourth rib with very minimally displaced fracture along the lateral aspect of the left scapula inferior to the glenoid. Minimally displaced left superior and inferior pubic rami fractures. No acute solid organ injury. Small bilateral adrenal masses likely adenomas. Mild colonic diverticulosis. Electronically Signed   By: Elberta Fortis M.D.   On: 07/03/2018 07:41   Ct Cervical Spine Wo Contrast  Result Date: 07/03/2018 CLINICAL DATA:  Fall EXAM: CT CERVICAL SPINE WITHOUT CONTRAST TECHNIQUE: Multidetector CT imaging of the cervical spine was performed without intravenous contrast. Multiplanar CT image reconstructions were also generated. COMPARISON:  None. FINDINGS: Alignment: No static subluxation. Facets are aligned. Occipital condyles and the lateral masses of C1 and C2 are normally approximated. Skull base and vertebrae: C4-7 ACDF and C7-T1 posterior fixation without hardware abnormality. No acute fracture. Soft tissues and spinal canal: No prevertebral fluid or swelling. No visible canal hematoma. Disc levels: No advanced spinal canal or neural foraminal stenosis. Upper chest: No pneumothorax, pulmonary nodule or pleural effusion. Other: Normal visualized paraspinal cervical soft tissues. IMPRESSION: No fracture or static subluxation of the cervical spine. Electronically Signed   By: Deatra Robinson M.D.   On: 07/03/2018 05:42  Ct Abdomen Pelvis W Contrast  Result Date: 07/03/2018 CLINICAL DATA:  Missed step and tripped landing on left side last night. Left wrist, scapula and groin pain. EXAM: CT CHEST, ABDOMEN, AND PELVIS WITH CONTRAST TECHNIQUE: Multidetector CT imaging of the chest, abdomen and pelvis was performed following the standard protocol during bolus  administration of intravenous contrast. CONTRAST:  OMNIPAQUE IOHEXOL 300 MG/ML  SOLN COMPARISON:  CT chest 02/26/2013 and CT abdomen/pelvis 09/22/2012 FINDINGS: CT CHEST FINDINGS Cardiovascular: Heart is normal size. Vascular structures are unremarkable. Mediastinum/Nodes: No mediastinal or hilar adenopathy. No mediastinal fluid. Lungs/Pleura: Lungs are adequately inflated and demonstrate mild dependent posterior bibasilar atelectasis. There are a few small bilateral calcified granulomas. Airways are normal. Musculoskeletal: Subtle fracture of the left posterior fourth rib. Subtle very minimally displaced fracture of the lateral aspect of the left scapula inferior to the glenoid. CT ABDOMEN PELVIS FINDINGS Hepatobiliary: Previous cholecystectomy. Liver and biliary tree are normal. Pancreas: Normal. Spleen: Normal. Adrenals/Urinary Tract: 1.5 cm right adrenal gland and 1.8 cm left adrenal gland likely adenomas. Kidneys are normal in size without hydronephrosis or nephrolithiasis. Ureters and bladder are normal. Stomach/Bowel: Stomach and small bowel are normal. Appendix not visualized. Mild colonic diverticulosis. Vascular/Lymphatic: Normal. Reproductive: Previous hysterectomy. Other: No free fluid or focal inflammatory change. No free peritoneal air. Tiny umbilical hernia containing only peritoneal fat. Musculoskeletal: Minimally displaced left inferior and superior pubic ramus fractures. Posterior fusion hardware from L1-S1 intact. Mild degenerate change of the spine. Moderate disc disease at the T12-L1 level. IMPRESSION: Subtle fracture of the posterior left fourth rib with very minimally displaced fracture along the lateral aspect of the left scapula inferior to the glenoid. Minimally displaced left superior and inferior pubic rami fractures. No acute solid organ injury. Small bilateral adrenal masses likely adenomas. Mild colonic diverticulosis. Electronically Signed   By: Elberta Fortis M.D.   On:  07/03/2018 07:41    Anti-infectives: Anti-infectives (From admission, onward)   Start     Dose/Rate Route Frequency Ordered Stop   07/04/18 0600  ceFAZolin (ANCEF) IVPB 2g/100 mL premix     2 g 200 mL/hr over 30 Minutes Intravenous To Short Stay 07/03/18 1237 07/05/18 0600       Assessment/Plan Fall Left scapula fx-- May be WBAT, no operative intervention indicated, sling Left distal radius fx-- OR today for ORIF.  PT/OT to follow Left sup/inf pubic rami fxs-- WBAT, no operative intervention indicated, PT/OT Left rib fx - pain control, IS  FEN: NPO for OR today, may have a diet post op VTE: SCD's, lovenox ID: ancef, preop Foley: purewick/bedpan Follow up: tbd   LOS: 1 day    Letha Cape , [facility] 07/04/2018, 8:39 AM Pager: [phone]

## 2018-07-04 NOTE — Progress Notes (Signed)
Rehab Admissions Coordinator Note:  Patient was screened by Clois Dupes for appropriateness for an Inpatient Acute Rehab Consult.  per PT recommendation.  At this time, we are recommending Inpatient Rehab consult if pt would like to be considered for admit.   Clois Dupes 07/04/2018, 12:41 PM  I can be reached at [phone].

## 2018-07-04 NOTE — Care Management Note (Signed)
Case Management Note  Patient Details  Name: Rachel Allison MRN: [id number] Date of Birth: 29-Nov-1955  Subjective/Objective:   Pt admitted on 07/03/18 after falling down steps and landing on her LT side. She sustained Lt wrist, scapula, rib and pelvic fractures.  PTA, pt independent and living in a 39 foot camper.                    Action/Plan: PT recommending CIR, and rehab consult requested.  Family able to to provide assistance at dc; she plans to dc to daughter's handicap accessible apartment after discharge.  Will follow.    Expected Discharge Date:                  Expected Discharge Plan:  IP Rehab Facility  In-House Referral:  Clinical Social Work  Discharge planning Services  CM Consult  Post Acute Care Choice:    Choice offered to:     DME Arranged:    DME Agency:     HH Arranged:    HH Agency:     Status of Service:  In process, will continue to follow  If discussed at Long Length of Stay Meetings, dates discussed:    Additional Comments:  [name], RN, BSN  Trauma/Neuro ICU Case Manager [phone]

## 2018-07-04 NOTE — Anesthesia Preprocedure Evaluation (Signed)
Anesthesia Evaluation  Patient identified by MRN, date of birth, ID band Patient awake    Reviewed: Allergy & Precautions, NPO status , Patient's Chart, lab work & pertinent test results  History of Anesthesia Complications (+) PONV, DIFFICULT AIRWAY and history of anesthetic complications  Airway Mallampati: I  TM Distance: >3 FB Neck ROM: Full    Dental  (+) Edentulous Upper   Pulmonary Current Smoker,    Pulmonary exam normal breath sounds clear to auscultation       Cardiovascular hypertension, Pt. on medications Normal cardiovascular exam Rhythm:Regular Rate:Normal  ECG: SR, rate 79   Neuro/Psych PSYCHIATRIC DISORDERS negative neurological ROS     GI/Hepatic negative GI ROS, Neg liver ROS,   Endo/Other  negative endocrine ROS  Renal/GU negative Renal ROS     Musculoskeletal  (+) Arthritis , Fibromyalgia -Chronic back pain   Abdominal   Peds  Hematology HLD   Anesthesia Other Findings left distal radius fracture  Reproductive/Obstetrics                             Anesthesia Physical Anesthesia Plan  ASA: III  Anesthesia Plan: General   Post-op Pain Management:    Induction: Intravenous  PONV Risk Score and Plan: 3 and Midazolam, Dexamethasone, Ondansetron and Treatment may vary due to age or medical condition  Airway Management Planned: Oral ETT and Video Laryngoscope Planned  Additional Equipment:   Intra-op Plan:   Post-operative Plan: Extubation in OR  Informed Consent: I have reviewed the patients History and Physical, chart, labs and discussed the procedure including the risks, benefits and alternatives for the proposed anesthesia with the patient or authorized representative who has indicated his/her understanding and acceptance.   Dental advisory given  Plan Discussed with: CRNA  Anesthesia Plan Comments:         Anesthesia Quick Evaluation

## 2018-07-04 NOTE — ED Provider Notes (Signed)
  Physical Exam  BP 125/73 (BP Location: Right Arm)   Pulse 69   Temp 98.7 F (37.1 C) (Oral)   Resp 18   Wt 86 kg   SpO2 94%   BMI 33.59 kg/m   Physical Exam  ED Course/Procedures     .Ortho Injury Treatment Performed by: Azalia Bilis, MD Authorized by: Azalia Bilis, MD  Comments: See Below procedure note  .Nerve Block Performed by: Azalia Bilis, MD Authorized by: Azalia Bilis, MD   Comments:     See below note regarding HEMATOMA BLOCK    MDM    multiple orthopedic injuries:  Left distal radius fx Left scapula fx Left superior and inferior pubic rami fracture Left rib fracture  I personally reviewed the imaging tests through PACS system  Consultants: Charma Igo, [place] - Orthopedics Dr Janee Morn, MD - Trauma    >>>>>>>>>>>>>>>>>>>>>>>>>>>>>>>>>>>>>>  Reduction of Fracture Performed by: Azalia Bilis Consent: Verbal consent obtained. Risks and benefits: risks, benefits and alternatives were discussed Consent given by: patient Required items: required blood products, implants, devices, and special equipment available Time out: Immediately prior to procedure a "time out" was called to verify the correct patient, procedure, equipment, support staff and site/side marked as required.  Patient sedated: versed  Vitals: Vital signs were monitored during sedation. Patient tolerance: Patient tolerated the procedure well with no immediate complications. Bone: Distal left radius Reduction technique: manipulation with C arm verification    HEMATOMA BLOCK Performed by: Azalia Bilis Consent: Verbal consent obtained. Required items: required blood products, implants, devices, and special equipment available Time out: Immediately prior to procedure a "time out" was called to verify the correct patient, procedure, equipment, support staff and site/side marked as required. Indication: Fracture Reduction Hematoma block body site: Distal left  radius Preparation: Patient was prepped and draped in the usual sterile fashion. Needle gauge: 24 G Location technique: anatomical landmarks Local anesthetic: lidocaine 2% with epi Anesthetic total: 15 ml Outcome: pain improved Patient tolerance: Patient tolerated the procedure well with no immediate complications.     >>>>>>>>>>>>>>>>>>>>>>>>>>>>>>>>>>>>>>>     Dispo: Admit for pain control and orthopedic management of fractures, PT, pain control     Azalia Bilis, MD 07/04/18 [phone]

## 2018-07-05 ENCOUNTER — Encounter (HOSPITAL_COMMUNITY): Payer: Self-pay | Admitting: Orthopedic Surgery

## 2018-07-05 DIAGNOSIS — W19XXXA Unspecified fall, initial encounter: Secondary | ICD-10-CM

## 2018-07-05 DIAGNOSIS — S52502A Unspecified fracture of the lower end of left radius, initial encounter for closed fracture: Secondary | ICD-10-CM | POA: Diagnosis present

## 2018-07-05 DIAGNOSIS — S32810B Multiple fractures of pelvis with stable disruption of pelvic ring, initial encounter for open fracture: Secondary | ICD-10-CM

## 2018-07-05 DIAGNOSIS — S42102A Fracture of unspecified part of scapula, left shoulder, initial encounter for closed fracture: Secondary | ICD-10-CM

## 2018-07-05 HISTORY — DX: Fracture of unspecified part of scapula, left shoulder, initial encounter for closed fracture: S42.102A

## 2018-07-05 HISTORY — DX: Multiple fractures of pelvis with stable disruption of pelvic ring, initial encounter for open fracture: S32.810B

## 2018-07-05 MED ORDER — METHOCARBAMOL 750 MG PO TABS
750.0000 mg | ORAL_TABLET | Freq: Three times a day (TID) | ORAL | Status: DC
  Administered 2018-07-05 – 2018-07-07 (×7): 750 mg via ORAL
  Filled 2018-07-05 (×7): qty 1

## 2018-07-05 MED ORDER — TRAMADOL HCL 50 MG PO TABS: 50.0000 mg | ORAL_TABLET | Freq: Four times a day (QID) | ORAL | Status: DC | PRN

## 2018-07-05 MED ORDER — ACETAMINOPHEN 500 MG PO TABS
1000.0000 mg | ORAL_TABLET | Freq: Four times a day (QID) | ORAL | Status: DC
  Administered 2018-07-05 – 2018-07-07 (×9): 1000 mg via ORAL
  Filled 2018-07-05 (×7): qty 2

## 2018-07-05 MED ORDER — MORPHINE SULFATE (PF) 2 MG/ML IV SOLN: 1.0000 mg | INTRAVENOUS | Status: DC | PRN

## 2018-07-05 NOTE — Consult Note (Signed)
Physical Medicine and Rehabilitation Consult Reason for Consult:Decreased functional mobility Referring Physician:  Trauma services   HPI: Rachel Allison is a 62 y.o.right handed female with history of chronic back pain with cervical and lumbar disc fusions, fibromyalgia and hypertension. Per chart review patient lives alone in a 39 foot camper and was going to the bathroom when she fell down 3 steps. No loss of consciousness. Noted dizziness after the fall.  X-rays and imaging revealed comminuted fracture of the distal left radius with dorsal angulation and displacement. Nondisplaced fracture at the superior border of the left scapula.  CT of chest abdomen and pelvis showed subtle fracture of the posterior left fourth rib as well as minimally displaced left superior and inferior pubic rami fractures. Underwent ORIF left wrist fracture 07/04/2018. Nonweightbearing through the left wrist with sling for comfort. Weightbearing as tolerated left lower extremity. Hospital course pain management.Subcutaneous Lovenox for DVT prophylaxis. Therapy evaluations completed with recommendations of physical medicine rehabilitation consult.  She lives in camper however this is a 2 bedroom to bathroom, it has tall steps.  Patient's daughter states that the patient can come home with her after hospitalization.   Review of Systems  Constitutional: Negative for chills and fever.  HENT: Negative for hearing loss.   Eyes: Negative for blurred vision and double vision.  Respiratory: Negative for cough and shortness of breath.   Cardiovascular: Negative for chest pain, palpitations and leg swelling.  Gastrointestinal: Positive for constipation. Negative for nausea.       GERD  Genitourinary: Negative for flank pain and hematuria.  Musculoskeletal: Positive for back pain and myalgias.  Skin: Negative for rash.  All other systems reviewed and are negative.  Past Medical History:  Diagnosis Date  .  Arthritis    "qwhere" (07/03/2018)  . Chronic back pain    "all my back" (07/03/2018)  . Difficult intubation    difficult airway/FYI  . Dysrhythmia    Hx: Palpitations  . Fibromyalgia   . GERD (gastroesophageal reflux disease)   . History of stomach ulcers   . Hypertension   . PONV (postoperative nausea and vomiting)    Past Surgical History:  Procedure Laterality Date  . ABDOMINAL HYSTERECTOMY  1984  . ANTERIOR CERVICAL DECOMP/DISCECTOMY FUSION  03/13/2012   Procedure: ANTERIOR CERVICAL DECOMPRESSION/DISCECTOMY FUSION 3 LEVELS;  Surgeon: Mariam Dollar, MD;  Location: [facility];  Service: Neurosurgery;  Laterality: N/A;  Cervical Four-five,Cervical Five-Six,Cervical Six-Seven Anterior Cervical Decompression and Fusion  . ANTERIOR LAT LUMBAR FUSION  09/29/2011   Procedure: ANTERIOR LATERAL LUMBAR FUSION 2 LEVELS;  Surgeon: Mariam Dollar, MD;  Location: [facility];  Service: Neurosurgery;  Laterality: N/A;  Lumbar two- three, three-four,  Anterior Lateral Interbody Fusion  . BACK SURGERY    . CARPAL TUNNEL RELEASE Left 2000s  . COLONOSCOPY     Hx: of   . DILATION AND CURETTAGE OF UTERUS  1973  . INCONTINENCE SURGERY    . KNEE ARTHROSCOPY Left 2000s  . LAMINECTOMY WITH POSTERIOR LATERAL ARTHRODESIS LEVEL 1 N/A 08/17/2013   Procedure: Lumbar five-Sacral one Posterior Lumbar Arthrodesis, Iliac Crest Bone Graft;  Surgeon: Mariam Dollar, MD;  Location: [facility];  Service: Neurosurgery;  Laterality: N/A;  Lumbar five-Sacral one Posterior Lumbar Arthrodesis, Iliac Crest Bone Graft  . LAPAROSCOPIC CHOLECYSTECTOMY  2005  . PLANTAR'S WART EXCISION Right   . POSTERIOR CERVICAL FUSION/FORAMINOTOMY N/A 08/17/2013   Procedure: Cervical seven-Thoracic one Posterior Cervical Fusion with lateral  mass fixation;  Surgeon: Mariam Dollar, MD;  Location: [facility];  Service: Neurosurgery;  Laterality: N/A;  Cervical seven-Thoracic one Posterior Cervical Fusion with lateral mass fixation   Family History    Problem Relation Age of Onset  . Osteoarthritis Mother   . Heart attack Father   . Cancer Father   . Hypertension Father   . Diabetes Sister    Social History:  reports that she has been smoking cigarettes. She has a 48.00 pack-year smoking history. She has never used smokeless tobacco. She reports that she drank alcohol. She reports that she does not use drugs. Allergies: No Known Allergies Medications Prior to Admission  Medication Sig Dispense Refill  . atorvastatin (LIPITOR) 20 MG tablet Take 20 mg by mouth daily.  5  . baclofen (LIORESAL) 20 MG tablet Take 20 mg by mouth at bedtime.  5  . diltiazem (CARDIZEM CD) 180 MG 24 hr capsule Take 180 mg by mouth daily.  0  . DULoxetine (CYMBALTA) 60 MG capsule Take 60 mg by mouth 2 (two) times daily.    Marland Kitchen esomeprazole (NEXIUM) 40 MG capsule Take 40 mg by mouth daily before breakfast.    . olmesartan (BENICAR) 40 MG tablet Take 40 mg by mouth daily.      Home: Home Living Family/patient expects to be discharged to:: Private residence Living Arrangements: Children Available Help at Discharge: Family, Available PRN/intermittently Type of Home: Apartment Home Access: Level entry Home Layout: One level Bathroom Shower/Tub: Engineer, manufacturing systems: Handicapped height Bathroom Accessibility: Yes Home Equipment: None Additional Comments: This is daughter's home; Pt lives in [place]  Functional History: Prior Function Level of Independence: Independent Functional Status:  Mobility: Bed Mobility Overal bed mobility: Needs Assistance Bed Mobility: Supine to Sit, Sit to Supine Supine to sit: Min assist, HOB elevated Sit to supine: HOB elevated, Min assist General bed mobility comments: sit - sidelying to reduce pelvic pain during mobility Transfers Overall transfer level: Needs assistance Equipment used: 1 person hand held assist Transfers: Stand Pivot Transfers Stand pivot transfers: Min assist General transfer comment: modA  for stand pivot to<>from BSC to provide support to off weight R LE to pivot on ball of foot  Ambulation/Gait General Gait Details: unable to attempt    ADL: ADL Overall ADL's : Needs assistance/impaired Grooming: Minimal assistance Upper Body Bathing: Minimal assistance, Sitting Lower Body Bathing: Moderate assistance, Sit to/from stand Upper Body Dressing : Minimal assistance, Sitting Lower Body Dressing: Moderate assistance, Sit to/from stand Toilet Transfer: Minimal assistance, Stand-pivot, BSC Toileting- Clothing Manipulation and Hygiene: Set up, Supervision/safety, Sitting/lateral lean, Sit to/from stand Functional mobility during ADLs: Minimal assistance  Cognition: Cognition Overall Cognitive Status: Within Functional Limits for tasks assessed Orientation Level: Oriented X4 Cognition Arousal/Alertness: Awake/alert Behavior During Therapy: WFL for tasks assessed/performed Overall Cognitive Status: Within Functional Limits for tasks assessed  Blood pressure 127/75, pulse 68, temperature 98.4 F (36.9 C), temperature source Oral, resp. rate 12, weight 86 kg, SpO2 93 %. Physical Exam  Vitals reviewed. Constitutional: She is oriented to person, place, and time. She appears well-developed.  HENT:  Head: Normocephalic.  Eyes: EOM are normal.  Neck: Normal range of motion. Neck supple. No thyromegaly present.  Cardiovascular: Normal rate, regular rhythm and normal heart sounds.  Respiratory: Effort normal and breath sounds normal. No respiratory distress.  GI: Soft. Bowel sounds are normal. She exhibits no distension.  Neurological: She is alert and oriented to person, place, and time.  Skin:  Left wrist  with splint in place  Motor strength is 5/5 in the right deltoid bicep tricep grip hip flexor knee extensor ankle dorsi flexion 2- limited by pain in the left deltoid 3- bicep tricep to minus finger flexors extensors 2- left hip flexor, 3- ankle dorsiflexor plantar flexor to  minus knee extensor all limited by pain. She has passive range of motion with flexion at the hip but she has pain with eccentric contraction as her left lower extremity is lowered  No results found for this or any previous visit (from the past 24 hour(s)). Dg Wrist 2 Views Left  Result Date: 07/03/2018 CLINICAL DATA:  Fracture EXAM: LEFT WRIST - 2 VIEW COMPARISON:  07/03/2018 FINDINGS: Two views study has fine bony detail obscured by overlying fiberglass splint. Comminuted fracture of the distal radius shows interval improvement in bony alignment, status post close reduction. No evidence for associated fracture of the distal ulna. Degenerative changes are noted in the first carpometacarpal joint. IMPRESSION: Improved alignment status post closed reduction of severely comminuted distal radius fracture. Electronically Signed   By: Kennith Center M.D.   On: 07/03/2018 12:41   Dg Wrist Complete Left  Result Date: 07/04/2018 CLINICAL DATA:  Postop ORIF. EXAM: LEFT WRIST - COMPLETE 3+ VIEW COMPARISON:  Earlier today FINDINGS: Three views through a plaster splint show ORIF of the distal radial fracture with a volar plate and screws. Components appear well positioned. Alignment and position appear near anatomic. IMPRESSION: Near anatomic position and alignment following ORIF of distal radial fracture. Electronically Signed   By: Paulina Fusi M.D.   On: 07/04/2018 21:00   Dg Wrist Complete Left  Result Date: 07/04/2018 CLINICAL DATA:  ORIF LEFT wrist fracture EXAM: DG C-ARM 61-120 MIN; LEFT WRIST - COMPLETE 3+ VIEW COMPARISON:  07/03/2018 FINDINGS: Intraoperative spot views of the LEFT wrist are submitted postoperatively for interpretation. Internal plate and screw fixation traverses a comminuted distal radial fracture, in near-anatomic alignment and position. No definite complicating features identified. IMPRESSION: ORIF distal radial fracture, in near-anatomic alignment and position. Electronically Signed    By: Harmon Pier M.D.   On: 07/04/2018 13:55   Dg C-arm 1-60 Min  Result Date: 07/04/2018 CLINICAL DATA:  ORIF LEFT wrist fracture EXAM: DG C-ARM 61-120 MIN; LEFT WRIST - COMPLETE 3+ VIEW COMPARISON:  07/03/2018 FINDINGS: Intraoperative spot views of the LEFT wrist are submitted postoperatively for interpretation. Internal plate and screw fixation traverses a comminuted distal radial fracture, in near-anatomic alignment and position. No definite complicating features identified. IMPRESSION: ORIF distal radial fracture, in near-anatomic alignment and position. Electronically Signed   By: Harmon Pier M.D.   On: 07/04/2018 13:55    Assessment/Plan: Diagnosis: Left distal radial fx, Left Sup/inf pubic ramus fracture, left scapular fracture due to fall 1. Does the need for close, 24 hr/day medical supervision in concert with the patient's rehab needs make it unreasonable for this patient to be served in a less intensive setting? Yes 2. Co-Morbidities requiring supervision/potential complications: Pain related to fractures 3. Due to bladder management, bowel management, safety, skin/wound care, disease management, medication administration, pain management and patient education, does the patient require 24 hr/day rehab nursing? Yes 4. Does the patient require coordinated care of a physician, rehab nurse, PT (1-2 hrs/day, 5 days/week) and OT (1-2 hrs/day, 5 days/week) to address physical and functional deficits in the context of the above medical diagnosis(es)? Yes Addressing deficits in the following areas: balance, endurance, locomotion, strength, transferring, bowel/bladder control, bathing, dressing, feeding, grooming, toileting and  psychosocial support 5. Can the patient actively participate in an intensive therapy program of at least 3 hrs of therapy per day at least 5 days per week? Yes 6. The potential for patient to make measurable gains while on inpatient rehab is excellent 7. Anticipated  functional outcomes upon discharge from inpatient rehab are supervision  with PT, modified independent and supervision with OT, n/a with SLP. 8. Estimated rehab length of stay to reach the above functional goals is: 7-10d 9. Anticipated D/C setting: Home 10. Anticipated post D/C treatments: HH therapy 11. Overall Rehab/Functional Prognosis: excellent  RECOMMENDATIONS: This patient's condition is appropriate for continued rehabilitative care in the following setting: CIR Patient has agreed to participate in recommended program. Yes Note that insurance prior authorization may be required for reimbursement for recommended care.  Comment: Needs to manage pain without IV opiates prior to transfer to rehab  "I have personally performed a face to face diagnostic evaluation of this patient.  Additionally, I have reviewed and concur with the physician assistant's documentation above." Erick Colace M.D. [facility] FAAPM&R (Sports Med, Neuromuscular Med) Diplomate Am Board of Electrodiagnostic Med  Charlton Amor, PA-C 07/05/2018

## 2018-07-05 NOTE — Progress Notes (Signed)
Orthopedic Tech Progress Note Patient Details:  Rachel Allison March 29, 1956 [id number]  Ortho Devices Type of Ortho Device: Arm sling Ortho Device/Splint Location: lue Ortho Device/Splint Interventions: Application   Post Interventions Patient Tolerated: Well Instructions Provided: Care of device   [name] 07/05/2018, 9:24 AM

## 2018-07-05 NOTE — Progress Notes (Signed)
Physical Therapy Treatment Patient Details Name: Rachel Allison MRN: [id number] DOB: November 07, 1955 Today's Date: 07/05/2018    History of Present Illness 62 yo female with a history of HTN, GERD, cervical and lumbar disc fusion, fibromyalgia, who presented to the ED after a fall. Pt lives in a 16ft camper and was going to the bathroom this am when she fell down 3 steps. She did not hit the steps and landed on her left side. She states no LOC. Dizziness after the fall. Imaging revealed L wrist, L scapula, L rib and L pelvic fxs. Pt to have ORIF on L wrist 06/03/18    PT Comments    Pt eager to work with therapy today and appears highly motivated to return to PLOF. She was able to perform bed mobility min guard and transfers to bedside commode and chair with light MinA for stability as needed. Pt says greatest difficulty is moving LE limited by L groin pain. DC plan remains appropriate at this time, PT will continue to follow.     Follow Up Recommendations  CIR     Equipment Recommendations  Other (comment)(TBD)       Precautions / Restrictions Precautions Precautions: Fall Precaution Comments: s/p fall down steps Required Braces or Orthoses: Sling;Other Brace/Splint(L wrist splinted, sling in room not on pt on entry) Restrictions Weight Bearing Restrictions: Yes LUE Weight Bearing: Non weight bearing(wrist NWB, scapula WBAT) LLE Weight Bearing: Weight bearing as tolerated    Mobility  Bed Mobility Overal bed mobility: Needs Assistance Bed Mobility: Supine to Sit     Supine to sit: Min guard     General bed mobility comments: min guard for safety, pt moved to EoB using RLE to assist LLE, and RUE on bed rail.  Transfers Overall transfer level: Needs assistance Equipment used: 1 person hand held assist Transfers: Stand Pivot Transfers;Sit to/from Stand Sit to Stand: Min assist Stand pivot transfers: Min assist       General transfer comment: light minA for safety provided  during transfer to bedside commode and transfer from bedside commode to chair. Pt able to stand pivot and scoot laterally. All transfers made towards her right side.      Balance Overall balance assessment: Needs assistance Sitting-balance support: Single extremity supported;Feet supported Sitting balance-[name]: Fair     Standing balance support: Single extremity supported Standing balance-[name]: Fair                              Cognition Arousal/Alertness: Awake/alert Behavior During Therapy: WFL for tasks assessed/performed Overall Cognitive Status: Within Functional Limits for tasks assessed                                           General Comments General comments (skin integrity, edema, etc.): pt highly motivated and eager to do as much independently as she can. She shows awareness of deficits and says greatest difficulty is lowering leg after raising it, or getting back into the bed from the chair. Pt instructed on importance of pain management.       Pertinent Vitals/Pain Pain Assessment: Faces Faces Pain Scale: Hurts little more Pain Location: L pelvis/rib hurting more today than L wrist Pain Descriptors / Indicators: Sharp;Aching;Constant;Grimacing Pain Intervention(s): Limited activity within patient's tolerance;Monitored during session;Repositioned  PT Goals (current goals can now be found in the care plan section) Acute Rehab PT Goals Patient Stated Goal: be independent PT Goal Formulation: With patient/family Time For Goal Achievement: 07/18/18 Potential to Achieve Goals: Good    Frequency    Min 3X/week      PT Plan Current plan remains appropriate       AM-PAC PT "6 Clicks" Daily Activity  Outcome Measure  Difficulty turning over in bed (including adjusting bedclothes, sheets and blankets)?: Unable Difficulty moving from lying on back to sitting on the side of the bed? : Unable Difficulty  sitting down on and standing up from a chair with arms (e.g., wheelchair, bedside commode, etc,.)?: Unable Help needed moving to and from a bed to chair (including a wheelchair)?: A Lot Help needed walking in hospital room?: Total Help needed climbing 3-5 steps with a railing? : Total 6 Click Score: 7    End of Session   Activity Tolerance: Patient limited by pain Patient left: in chair;with call bell/phone within reach(ready for transport to OR ) Nurse Communication: Mobility status PT Visit Diagnosis: Unsteadiness on feet (R26.81);Other abnormalities of gait and mobility (R26.89);History of falling (Z91.81);Difficulty in walking, not elsewhere classified (R26.2);Pain Pain - Right/Left: Left Pain - part of body: Shoulder;Hand;Hip     Time: 1610-9604 PT Time Calculation (min) (ACUTE ONLY): 26 min  Charges:  $Therapeutic Activity: 23-37 mins                     Rinaldo Cloud, SPT Acute Rehabilitation Services Office [phone]    Rinaldo Cloud 07/05/2018, 4:42 PM

## 2018-07-05 NOTE — Progress Notes (Signed)
Patient ID: Rachel Allison, female   DOB: 1956/04/20, 62 y.o.   MRN: [id number]    1 Day Post-Op  Subjective: Pt's arm feels much better after OR yesterday by Dr. Carola Frost.  Now just having a lot of pain with mobilization of her pelvis.  Otherwise tolerating her diet and passing flatus.  Objective: Vital signs in last 24 hours: Temp:  [97.3 F (36.3 C)-99.1 F (37.3 C)] 98.4 F (36.9 C) (11/06 0415) Pulse Rate:  [66-79] 68 (11/06 0415) Resp:  [12-20] 12 (11/06 0415) BP: (103-130)/(63-75) 127/75 (11/06 0415) SpO2:  [90 %-95 %] 93 % (11/06 0415) Last BM Date: 07/03/18  Intake/Output from previous day: 11/05 0701 - 11/06 0700 In: 2227.3 [I.V.:2033.5; IV Piggyback:193.8] Out: 25 [Blood:25] Intake/Output this shift: No intake/output data recorded.  PE: Gen: NAD Heart: regular Lungs: CTAB Abd: soft, NT, ND, +BS Ext: splint on LUE.  Good cap refill, hand warm, and normal sensation.  Otherwise NVI throughout  Lab Results:  Recent Labs    07/03/18 0551  WBC 15.0*  HGB 12.5  HCT 39.5  PLT 268   BMET Recent Labs    07/03/18 0551  NA 136  K 4.2  CL 108  CO2 23  GLUCOSE 115*  BUN 14  CREATININE 0.76  CALCIUM 9.1   PT/INR No results for input(s): LABPROT, INR in the last 72 hours. CMP     Component Value Date/Time   NA 136 07/03/2018 0551   K 4.2 07/03/2018 0551   CL 108 07/03/2018 0551   CO2 23 07/03/2018 0551   GLUCOSE 115 (H) 07/03/2018 0551   BUN 14 07/03/2018 0551   CREATININE 0.76 07/03/2018 0551   CALCIUM 9.1 07/03/2018 0551   GFRNONAA >60 07/03/2018 0551   GFRAA >60 07/03/2018 0551   Lipase  No results found for: LIPASE     Studies/Results: Dg Wrist 2 Views Left  Result Date: 07/03/2018 CLINICAL DATA:  Fracture EXAM: LEFT WRIST - 2 VIEW COMPARISON:  07/03/2018 FINDINGS: Two views study has fine bony detail obscured by overlying fiberglass splint. Comminuted fracture of the distal radius shows interval improvement in bony alignment, status post  close reduction. No evidence for associated fracture of the distal ulna. Degenerative changes are noted in the first carpometacarpal joint. IMPRESSION: Improved alignment status post closed reduction of severely comminuted distal radius fracture. Electronically Signed   By: Kennith Center M.D.   On: 07/03/2018 12:41   Dg Wrist Complete Left  Result Date: 07/04/2018 CLINICAL DATA:  Postop ORIF. EXAM: LEFT WRIST - COMPLETE 3+ VIEW COMPARISON:  Earlier today FINDINGS: Three views through a plaster splint show ORIF of the distal radial fracture with a volar plate and screws. Components appear well positioned. Alignment and position appear near anatomic. IMPRESSION: Near anatomic position and alignment following ORIF of distal radial fracture. Electronically Signed   By: Paulina Fusi M.D.   On: 07/04/2018 21:00   Dg Wrist Complete Left  Result Date: 07/04/2018 CLINICAL DATA:  ORIF LEFT wrist fracture EXAM: DG C-ARM 61-120 MIN; LEFT WRIST - COMPLETE 3+ VIEW COMPARISON:  07/03/2018 FINDINGS: Intraoperative spot views of the LEFT wrist are submitted postoperatively for interpretation. Internal plate and screw fixation traverses a comminuted distal radial fracture, in near-anatomic alignment and position. No definite complicating features identified. IMPRESSION: ORIF distal radial fracture, in near-anatomic alignment and position. Electronically Signed   By: Harmon Pier M.D.   On: 07/04/2018 13:55   Dg C-arm 1-60 Min  Result Date: 07/04/2018 CLINICAL DATA:  ORIF LEFT wrist fracture EXAM: DG C-ARM 61-120 MIN; LEFT WRIST - COMPLETE 3+ VIEW COMPARISON:  07/03/2018 FINDINGS: Intraoperative spot views of the LEFT wrist are submitted postoperatively for interpretation. Internal plate and screw fixation traverses a comminuted distal radial fracture, in near-anatomic alignment and position. No definite complicating features identified. IMPRESSION: ORIF distal radial fracture, in near-anatomic alignment and position.  Electronically Signed   By: Harmon Pier M.D.   On: 07/04/2018 13:55    Anti-infectives: Anti-infectives (From admission, onward)   Start     Dose/Rate Route Frequency Ordered Stop   07/04/18 2000  ceFAZolin (ANCEF) IVPB 2g/100 mL premix     2 g 200 mL/hr over 30 Minutes Intravenous Every 8 hours 07/04/18 1610 07/05/18 2159   07/04/18 0600  ceFAZolin (ANCEF) IVPB 2g/100 mL premix     2 g 200 mL/hr over 30 Minutes Intravenous To Short Stay 07/03/18 1237 07/04/18 1156       Assessment/Plan Fall Left scapula fx-- May be WBAT, no operative intervention indicated, sling Left distal radius fx--ORIF by Dr. Carola Frost 07-04-18.   Left sup/inf pubic rami fxs-- WBAT, no operative intervention indicated, PT/OT Left rib fx- pain control, IS  [id number] diet VTE: SCD's, lovenox ID: [email or web address], preop Foley:none Dispo - CIR consult in place   LOS: 2 days    Letha Cape , [facility] Surgery 07/05/2018, 7:35 AM Pager: [phone]

## 2018-07-05 NOTE — Progress Notes (Signed)
Orthopedic Trauma Service Progress Note   Patient ID: Rachel Allison MRN: [id number] DOB/AGE: 1956-05-03 62 y.o.  Subjective:  Doing well Sitting on EOB States L wrist feels better than pre-op Pelvis hurts with ambulation   ROS As above  Objective:   VITALS:   Vitals:   07/04/18 1440 07/04/18 2115 07/05/18 0034 07/05/18 0415  BP: 103/70 125/68 130/69 127/75  Pulse: 66 79 79 68  Resp: 14 16 14 12   Temp: 98.3 F (36.8 C) 98.7 F (37.1 C) 99.1 F (37.3 C) 98.4 F (36.9 C)  TempSrc: Oral Oral Oral Oral  SpO2: 90% 90% 95% 93%  Weight:        Estimated body mass index is 33.59 kg/m as calculated from the following:   Height as of 08/17/13: 5\' 3"  (1.6 m).   Weight as of this encounter: 86 kg.   Intake/Output      11/05 0701 - 11/06 0700 11/06 0701 - 11/07 0700   P.O.     I.V. (mL/kg) 2033.5 (23.6)    IV Piggyback 193.8    Total Intake(mL/kg) 2227.3 (25.9)    Urine (mL/kg/hr)     Blood 25    Total Output 25    Net +2202.3         Urine Occurrence 3 x      LABS  No results found for this or any previous visit (from the past 24 hour(s)).   PHYSICAL EXAM:  Gen: awake and alert, NAD, appears well, sitting on EOB Lungs: breathing unlabored Cardiac: regular  Ext:       Left Upper Extremity   Splint fitting well  Ext warm  Moderate swelling to L hand   Moves fingers without difficulty  Radial, ulnar, median nv motor and sensory functions intact  R/U/M/AIN/PIN motor intact  Good color distally   Elbow nontender   Assessment/Plan: 1 Day Post-Op   Principal Problem:   Fall Active Problems:   Displaced fracture of distal end of left radius   Multiple open lateral compression fractures of pelvis with stable pelvic ring (HCC)   Closed left scapular fracture   Anti-infectives (From admission, onward)   Start     Dose/Rate Route Frequency Ordered Stop   07/04/18 2000  ceFAZolin (ANCEF) IVPB 2g/100 mL premix     2 g 200 mL/hr  over 30 Minutes Intravenous Every 8 hours 07/04/18 1610 07/05/18 2159   07/04/18 0600  ceFAZolin (ANCEF) IVPB 2g/100 mL premix     2 g 200 mL/hr over 30 Minutes Intravenous To Short Stay 07/03/18 1237 07/04/18 1156    .  POD/HD#: 1  62 y/o female s/p fall with multiple fractures  -fall  - comminuted L intra-articular distal radius fracture s/p ORIF  NWB L wrist  Ok to WB thru elbow  Finger motion, elbow motion as tolerated  Ice and elevate (fingers above elbow and elbow above heart)  PT/OT  Splint x 2 weeks then likely conversion to [facility]  - L LC 1 pelvic ring fracture  WBAT   ROM as tolerated  PT/OT  Platform walker for left side  Ice PRN   - L scapula fracture  Non-op   ROM as tolerated  Ice PRN   - Pain management:  Continue with current regimen   - ABL anemia/Hemodynamics  Cbc in am  - Medical issues   Per TS  - DVT/PE prophylaxis:  Mobilize   scds  Does not need pharmacologics from ortho standpoint  Will order knee  high teds  - ID:   periop abx completed today   - Metabolic Bone Disease:  Labs pending  - Activity:  As above   - FEN/GI prophylaxis/Foley/Lines:  Diet as tolerated   -Ex-fix/Splint care:  Keep splint clean and dry   - Impediments to fracture healing:  Poor bone density   Suspected osteoprosis given mechanism of injury and associated distal radius fracture   - Dispo:  PT/OT evals  Possible CIR   Ortho issues stable      Mearl Latin, PA-C [facility] [phone] (P) [phone] Traci Sermon (C) 07/05/2018, 9:44 AM

## 2018-07-05 NOTE — Progress Notes (Signed)
Inpatient Rehabilitation Admissions Coordinator  I have begin insurance authorization for a possible inpt rehab admit. I will meet with pt and daughter to discuss tomorrow.  Ottie Glazier, RN, MSN Rehab Admissions Coordinator [phone] 07/05/2018 5:22 PM

## 2018-07-05 NOTE — Plan of Care (Signed)
  Problem: Education: Goal: Knowledge of General Education information will improve Description: Including pain rating scale, medication(s)/side effects and non-pharmacologic comfort measures Outcome: Progressing   Problem: Activity: Goal: Risk for activity intolerance will decrease Outcome: Progressing   Problem: Nutrition: Goal: Adequate nutrition will be maintained Outcome: Progressing   

## 2018-07-05 NOTE — Plan of Care (Signed)

## 2018-07-05 NOTE — Progress Notes (Signed)
CSW met with patient and daughter at bedside. Patient provided permission to have discussion in front of daughter. CSW asked about drinking and drug use, patient denied; says she's worked out of a [place] office and seen what can happen, so she's always stayed away from it. Patient hopeful for CIR admission, with plan to stay with her daughter afterwards who can provide 24/7 assist. Daughter asked about who would come talk to them about CIR, and CSW informed them that an admissions coordinator would reach out to them after the MD had assessed.  CSW also asked patient about any distress she may be experiencing from the accident; patient denied any emotional stress response.  SBIRT completed. CSW signing off.  Laveda Abbe, [place] Clinical Social Worker [phone]

## 2018-07-06 ENCOUNTER — Observation Stay (HOSPITAL_COMMUNITY): Payer: Medicare Other

## 2018-07-06 LAB — VITAMIN D 25 HYDROXY (VIT D DEFICIENCY, FRACTURES): VIT D 25 HYDROXY: 22.8 ng/mL — AB (ref 30.0–100.0)

## 2018-07-06 NOTE — Progress Notes (Signed)
Inpatient Rehabilitation Admissions Coordinator  I have received a denial for an inpt rehab admit by [place] [employer] medicare. I discussed with Dr. Letta Pate and he feels their is not medical necessity to justify an appeal. I met with patient at bedside to discuss. She is upset but aware. She wishes to purse SNF rehab at facility close to home in [place]. I have alerted SW, [place]. We will sign off at this time.   Danne Baxter, RN, MSN Rehab Admissions Coordinator [phone] 07/06/2018 12:07 PM

## 2018-07-06 NOTE — Progress Notes (Signed)
Patient suffers from Left scapula fx, Left distal radius fx, Left superior/inferior pubic rami fractures, Left rib fracture which impairs their ability to perform daily activities like bathing, dressing, grooming and toileting in the home.  A hospital bed will allow patient to safely mobilize in bed and assist with activities of daily living.   Franne Forts, [facility] Surgery 07/06/2018, 3:57 PM Pager: [phone] Mon 7:00 am -11:30 AM Tues-Fri 7:00 am-4:30 pm Sat-Sun 7:00 am-11:30 am

## 2018-07-06 NOTE — Progress Notes (Signed)
Physical Therapy Treatment Patient Details Name: Rachel Allison MRN: [id number] DOB: May 21, 1956 Today's Date: 07/06/2018    History of Present Illness 62 yo female with a history of HTN, GERD, cervical and lumbar disc fusion, fibromyalgia, who presented to the ED after a fall. Pt lives in a 7ft camper and was going to the bathroom this am when she fell down 3 steps. She did not hit the steps and landed on her left side. She states no LOC. Dizziness after the fall. Imaging revealed L wrist, L scapula, L rib and L pelvic fxs. Pt to have ORIF on L wrist 06/03/18    PT Comments    Pt in bathroom finishing bathing on entry. Pt and family very disappointed that she will not be able to go to CIR for rehab and are trying to coordinate pt going to daughters home for rehab with HHPT. Pt is progressing with her transfers and currently only need min guard for safety. Pt was not able to progress to gait training as session cut short for transport to Xray. PT will follow back tomorrow for training on how to get in and out of car for transport home. Please see below equipment needs.      Follow Up Recommendations  Home health PT;Supervision/Assistance - 24 hour     Equipment Recommendations  Rolling walker with 5" wheels;3in1 (PT);Other (comment);Hospital bed(Left platform walker )    Recommendations for Other Services       Precautions / Restrictions Precautions Precautions: Fall Precaution Comments: s/p fall down steps Required Braces or Orthoses: Sling;Other Brace/Splint(L wrist splinted, sling in room not on pt on entry) Restrictions Weight Bearing Restrictions: Yes LUE Weight Bearing: Non weight bearing(wrist NWB, scapula WBAT) LLE Weight Bearing: Weight bearing as tolerated    Mobility  Bed Mobility Overal bed mobility: Needs Assistance Bed Mobility: Sit to Supine       Sit to supine: Min guard   General bed mobility comments: min guard for safety, with increased effort pt able to  transfer L LE into bed  Transfers Overall transfer level: Needs assistance Equipment used: 1 person hand held assist Transfers: Stand Pivot Transfers;Sit to/from Stand Sit to Stand: Min guard Stand pivot transfers: Min guard       General transfer comment: min guard for safety for sit>stand from w/c and for pivot from w/c back to bed  Ambulation/Gait             General Gait Details: unable to attempt         Balance Overall balance assessment: Needs assistance Sitting-balance support: Single extremity supported;Feet supported Sitting balance-[name]: Fair     Standing balance support: Single extremity supported Standing balance-[name]: Fair                              Cognition Arousal/Alertness: Awake/alert Behavior During Therapy: WFL for tasks assessed/performed Overall Cognitive Status: Within Functional Limits for tasks assessed                                           General Comments General comments (skin integrity, edema, etc.): Daughter and son in room. Pt very dissappointed about being denied at [facility] and is concerned about d/c home.       Pertinent Vitals/Pain Pain Assessment: Faces Faces Pain Scale: Hurts even more Pain  Location: L pelvis/rib hurting more today than L wrist Pain Descriptors / Indicators: Sharp;Aching;Constant;Grimacing Pain Intervention(s): Limited activity within patient's tolerance;Monitored during session;Repositioned           PT Goals (current goals can now be found in the care plan section) Acute Rehab PT Goals Patient Stated Goal: be independent PT Goal Formulation: With patient/family Time For Goal Achievement: 07/18/18 Potential to Achieve Goals: Good Progress towards PT goals: Progressing toward goals    Frequency    Min 5X/week      PT Plan Discharge plan needs to be updated;Equipment recommendations need to be updated       AM-PAC PT "6 Clicks" Daily Activity   Outcome Measure  Difficulty turning over in bed (including adjusting bedclothes, sheets and blankets)?: Unable Difficulty moving from lying on back to sitting on the side of the bed? : Unable Difficulty sitting down on and standing up from a chair with arms (e.g., wheelchair, bedside commode, etc,.)?: Unable Help needed moving to and from a bed to chair (including a wheelchair)?: A Lot Help needed walking in hospital room?: Total Help needed climbing 3-5 steps with a railing? : Total 6 Click Score: 7    End of Session   Activity Tolerance: Patient limited by pain Patient left: in chair;with call bell/phone within reach(ready for transport to OR ) Nurse Communication: Mobility status PT Visit Diagnosis: Unsteadiness on feet (R26.81);Other abnormalities of gait and mobility (R26.89);History of falling (Z91.81);Difficulty in walking, not elsewhere classified (R26.2);Pain Pain - Right/Left: Left Pain - part of body: Shoulder;Hand;Hip     Time: 1610-9604 PT Time Calculation (min) (ACUTE ONLY): 15 min  Charges:  $Therapeutic Activity: 8-22 mins                     Rachel Allison B. Beverely Risen PT, DPT Acute Rehabilitation Services Pager [phone] Office [phone]    Rachel Allison [facility] 07/06/2018, 6:37 PM

## 2018-07-06 NOTE — Care Management Obs Status (Signed)
MEDICARE OBSERVATION STATUS NOTIFICATION   Patient Details  Name: Rachel Allison MRN: [id number] Date of Birth: December 04, 1955   Medicare Observation Status Notification Given:  Yes    Glennon Mac, RN 07/06/2018, 2:54 PM

## 2018-07-06 NOTE — Discharge Summary (Addendum)
Patient ID: TACARRA JUSTO [id number] [birth year] 62 y.o.  Admit date: 07/03/2018 Discharge date: 07/07/2018  Admitting Diagnosis: Fall Left scapula Fx Left distal radius Fx Left superior/inferior pubic rami FX Left rib FX  Discharge Diagnosis Patient Active Problem List   Diagnosis Date Noted  . Displaced fracture of distal end of left radius 07/05/2018  . Multiple open lateral compression fractures of pelvis with stable pelvic ring (HCC) 07/05/2018  . Closed left scapular fracture 07/05/2018  . Fall 07/03/2018  . 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 Carola Frost with ortho trauma  Reason for Admission: Pt is a 62 yo female with a history of HTN, GERD, cervical and lumbar disc fusion, fibromyalgia, who presented to the ED after a fall. Pt lives in a 56ft camper and was going to the bathroom this am when she fell down 3 steps. She did not hit the steps and landed on her left side. She states no LOC. Dizziness after the fall. No CP, SOB, abdominal pain. She is having constant severe, non radiating, sharp pain, worse with movement in her L wrist. Mild pain in her L shoulder. Severe pain in L pelvis with movement of her LLE. No other complaints or associated symptoms. No numbness or weakness. No urinary symptoms and bowels movements have been normal. Daughter at bedside. She states pt can live with her after discharge from the hospital.  Procedures ORIF of left distal radius FX, Dr. Carola Frost 07-04-18  Hospital Course:  The patient was admitted and was WBAT to her LLE secondary to her pelvic Fx.  She had a splint placed on her wrist and ortho was consulted.  ORIF was performed and patient tolerated this well.  Her scapula was ROM as tolerates and pain control for her rib fx that really doesn't seem to bother her.  PT/OT were consulted and CIR was recommended.  Insurance authorization was obtained and on POD 2, however, her insurance company denied authorization for  admission to inpatient rehab.  Ultimately the decision was made to go home with her daughter with medical equipment and home health services.  She will follow up with ortho for above injuries.  No follow up required for her rib fracture except PCP prn.  She was otherwise stable throughout her admission.  Physical Exam: Gen: NAD Heart: regular Lungs: CTAB Abd: soft, NT, ND, +BS Ext: swelling decreased in LUE.  Good cap refill, warm, and normal sensation.  NVI in BLEs.  Allergies as of 07/07/2018   No Known Allergies     Medication List    STOP taking these medications   baclofen 20 MG tablet Commonly known as:  LIORESAL     TAKE these medications   acetaminophen 500 MG tablet Commonly known as:  TYLENOL Take 2 tablets (1,000 mg total) by mouth every 6 (six) hours as needed.   atorvastatin 20 MG tablet Commonly known as:  LIPITOR Take 20 mg by mouth daily.   diltiazem 180 MG 24 hr capsule Commonly known as:  CARDIZEM CD Take 180 mg by mouth daily.   DULoxetine 60 MG capsule Commonly known as:  CYMBALTA Take 60 mg by mouth 2 (two) times daily.   esomeprazole 40 MG capsule Commonly known as:  NEXIUM Take 40 mg by mouth daily before breakfast.   methocarbamol 750 MG tablet Commonly known as:  ROBAXIN Take 1 tablet (750 mg total) by mouth every 8 (eight) hours as needed for muscle spasms.   olmesartan 40  MG tablet Commonly known as:  BENICAR Take 40 mg by mouth daily.   Oxycodone HCl 10 MG Tabs Take 0.5-1 tablets (5-10 mg total) by mouth every 4 (four) hours as needed.            Durable Medical Equipment  (From admission, onward)         Start     Ordered   07/06/18 1556  For home use only DME Bedside commode  Once    Question:  Patient needs a bedside commode to treat with the following condition  Answer:  Closed fracture of multiple pubic rami, left, initial encounter (HCC)   07/06/18 1559   07/06/18 1556  For home use only DME Walker rolling  Once      Question:  Patient needs a walker to treat with the following condition  Answer:  Closed fracture of multiple pubic rami, left, initial encounter (HCC)   07/06/18 1559   07/06/18 1556  For home use only DME Hospital bed  Once    Question Answer Comment  The above medical condition requires: Patient requires the ability to reposition frequently   Bed type Semi-electric      07/06/18 1559            Follow-up Information    Myrene Galas, MD Follow up in 2 week(s).   Specialty:  Orthopedic Surgery Contact information: [address] [place] [address] [phone]           Signed: Barnetta Chapel, [facility] 07/07/2018, 8:38 AM Pager: [phone]

## 2018-07-06 NOTE — Care Management Note (Addendum)
Case Management Note  Patient Details  Name: Rachel Allison MRN: [id number] Date of Birth: 12/07/1955  Subjective/Objective:   Pt admitted on 07/03/18 after falling down steps and landing on her LT side. She sustained Lt wrist, scapula, rib and pelvic fractures.  PTA, pt independent and living in a 39 foot camper.                    Action/Plan: PT recommending CIR, and rehab consult requested.  Family able to to provide assistance at dc; she plans to dc to daughter's handicap accessible apartment after discharge.  Will follow.    Expected Discharge Date:     07/07/18             Expected Discharge Plan:  Home w Home Health Services  In-House Referral:  Clinical Social Work  Discharge planning Services  CM Consult  Post Acute Care Choice:  Home Health Choice offered to:  Patient  DME Arranged:  3-N-1, Walker rolling, Hospital bed DME Agency:  [facility]  HH Arranged:  PT, OT [facility] Agency:  [facility]  Status of Service:  Completed, signed off  If discussed at Long Length of Stay Meetings, dates discussed:    Additional Comments:  07/06/18 J. Lynnwood Beckford, RN, BSN CIR admission has been denied by [employer].  Pt considered SNF for rehab briefly, but now wishes to go home with her daughter and [facility] services.  Daughter at bedside, who is a Engineer, civil (consulting).  She states that her husband, 3 yo son, and other family members will be able to assist to provide 24h care at discharge.  Referral to [facility] for [facility] and DME needs, per pt/daughter choice; start of care 24-48h post dc date.  RW to be delivered to pt's room prior to dc; hospital bed and 3 in 1 to be delivered to daughter's home.  Discharge address is [address] [place], [place] [address].  Call daughter for DME delivery:  [name]  [phone].    [name], RN, BSN  Trauma/Neuro ICU Case Manager [phone]

## 2018-07-06 NOTE — Progress Notes (Signed)
Patient ID: Rachel Allison, female   DOB: 14-Oct-1955, 63 y.o.   MRN: [id number]    2 Days Post-Op  Subjective: No new complaints.  Continues to feel better, but pelvis still tender with mobilization.  Objective: Vital signs in last 24 hours: Temp:  [98.2 F (36.8 C)-98.7 F (37.1 C)] 98.3 F (36.8 C) (11/07 0842) Pulse Rate:  [67-98] 67 (11/07 0842) Resp:  [17-18] 18 (11/07 0842) BP: (109-123)/(67-86) 123/78 (11/07 0842) SpO2:  [94 %-99 %] 99 % (11/07 0842) Last BM Date: 07/06/18  Intake/Output from previous day: 11/06 0701 - 11/07 0700 In: 1907.8 [P.O.:970; I.V.:937.8] Out: 1050 [Urine:1050] Intake/Output this shift: Total I/O In: 100 [P.O.:100] Out: -   PE: Heart: regular Lungs: CTAB Abd: soft, NT, ND Ext: much less edema in hand.  Good cap refill, NVI in BLEs  Lab Results:  No results for input(s): WBC, HGB, HCT, PLT in the last 72 hours. BMET No results for input(s): NA, K, CL, CO2, GLUCOSE, BUN, CREATININE, CALCIUM in the last 72 hours. PT/INR No results for input(s): LABPROT, INR in the last 72 hours. CMP     Component Value Date/Time   NA 136 07/03/2018 0551   K 4.2 07/03/2018 0551   CL 108 07/03/2018 0551   CO2 23 07/03/2018 0551   GLUCOSE 115 (H) 07/03/2018 0551   BUN 14 07/03/2018 0551   CREATININE 0.76 07/03/2018 0551   CALCIUM 9.1 07/03/2018 0551   GFRNONAA >60 07/03/2018 0551   GFRAA >60 07/03/2018 0551   Lipase  No results found for: LIPASE     Studies/Results: Dg Wrist Complete Left  Result Date: 07/04/2018 CLINICAL DATA:  Postop ORIF. EXAM: LEFT WRIST - COMPLETE 3+ VIEW COMPARISON:  Earlier today FINDINGS: Three views through a plaster splint show ORIF of the distal radial fracture with a volar plate and screws. Components appear well positioned. Alignment and position appear near anatomic. IMPRESSION: Near anatomic position and alignment following ORIF of distal radial fracture. Electronically Signed   By: Paulina Fusi M.D.   On:  07/04/2018 21:00   Dg Wrist Complete Left  Result Date: 07/04/2018 CLINICAL DATA:  ORIF LEFT wrist fracture EXAM: DG C-ARM 61-120 MIN; LEFT WRIST - COMPLETE 3+ VIEW COMPARISON:  07/03/2018 FINDINGS: Intraoperative spot views of the LEFT wrist are submitted postoperatively for interpretation. Internal plate and screw fixation traverses a comminuted distal radial fracture, in near-anatomic alignment and position. No definite complicating features identified. IMPRESSION: ORIF distal radial fracture, in near-anatomic alignment and position. Electronically Signed   By: Harmon Pier M.D.   On: 07/04/2018 13:55   Dg C-arm 1-60 Min  Result Date: 07/04/2018 CLINICAL DATA:  ORIF LEFT wrist fracture EXAM: DG C-ARM 61-120 MIN; LEFT WRIST - COMPLETE 3+ VIEW COMPARISON:  07/03/2018 FINDINGS: Intraoperative spot views of the LEFT wrist are submitted postoperatively for interpretation. Internal plate and screw fixation traverses a comminuted distal radial fracture, in near-anatomic alignment and position. No definite complicating features identified. IMPRESSION: ORIF distal radial fracture, in near-anatomic alignment and position. Electronically Signed   By: Harmon Pier M.D.   On: 07/04/2018 13:55    Anti-infectives: Anti-infectives (From admission, onward)   Start     Dose/Rate Route Frequency Ordered Stop   07/04/18 2000  ceFAZolin (ANCEF) IVPB 2g/100 mL premix     2 g 200 mL/hr over 30 Minutes Intravenous Every 8 hours 07/04/18 1610 07/05/18 1456   07/04/18 0600  ceFAZolin (ANCEF) IVPB 2g/100 mL premix     2 g 200  mL/hr over 30 Minutes Intravenous To Short Stay 07/03/18 1237 07/04/18 1156       Assessment/Plan Fall Left scapula fx-- May be WBAT, no operative intervention indicated, sling Left distal radius fx--ORIF by Dr. Carola Frost 07-04-18.   Left sup/inf pubic rami fxs-- WBAT, no operative intervention indicated, PT/OT Left rib fx- pain control, IS  ONG:EXBMWUX diet VTE: SCD's,  lovenox LK:GMWNU, preop Foley:none Dispo - CIR when insurance auth completed   LOS: 3 days    Letha Cape , [facility] Surgery 07/06/2018, 11:26 AM Pager: [phone]

## 2018-07-06 NOTE — Care Management CC44 (Signed)
Condition Code 44 Documentation Completed  Patient Details  Name: DANDRA SHAMBAUGH MRN: [id number] Date of Birth: [birth year]   Condition Code 44 given:  Yes Patient signature on Condition Code 44 notice:  Yes Documentation of 2 MD's agreement:  Yes Code 44 added to claim:  Yes    Glennon Mac, RN 07/06/2018, 2:54 PM

## 2018-07-06 NOTE — Care Management Important Message (Signed)
Important Message  Patient Details  Name: Rachel Allison MRN: [id number] Date of Birth: [birth year]   Medicare Important Message Given:  Yes    Dorena Bodo 07/06/2018, 2:05 PM

## 2018-07-07 MED ORDER — CALCIUM CITRATE 950 (200 CA) MG PO TABS
200.0000 mg | ORAL_TABLET | Freq: Two times a day (BID) | ORAL | Status: DC
  Administered 2018-07-07: 200 mg via ORAL
  Filled 2018-07-07: qty 1

## 2018-07-07 MED ORDER — OXYCODONE HCL 10 MG PO TABS: 5.0000 mg | ORAL_TABLET | ORAL | 0 refills | Status: AC | PRN

## 2018-07-07 MED ORDER — ACETAMINOPHEN 500 MG PO TABS: 1000.0000 mg | ORAL_TABLET | Freq: Four times a day (QID) | ORAL | 0 refills | Status: AC | PRN

## 2018-07-07 MED ORDER — VITAMIN D 25 MCG (1000 UNIT) PO TABS
2000.0000 [IU] | ORAL_TABLET | Freq: Two times a day (BID) | ORAL | Status: DC
  Administered 2018-07-07: 2000 [IU] via ORAL

## 2018-07-07 MED ORDER — METHOCARBAMOL 750 MG PO TABS: 750.0000 mg | ORAL_TABLET | Freq: Three times a day (TID) | ORAL | 0 refills | Status: AC | PRN

## 2018-07-07 MED ORDER — VITAMIN C 500 MG PO TABS
500.0000 mg | ORAL_TABLET | Freq: Every day | ORAL | Status: DC
  Administered 2018-07-07: 500 mg via ORAL
  Filled 2018-07-07: qty 1

## 2018-07-07 NOTE — Discharge Instructions (Signed)
Orthopaedic Trauma Service Discharge Instructions   General Discharge Instructions  WEIGHT BEARING STATUS:       Nonweightbearing Left wrist      Ok to Weightbear through Left elbow with platform walker     Weightbearing as tolerated both legs   RANGE OF MOTION/ACTIVITY:     No range of motion of left wrist, do not remove splint     Do not lift anything heavier that a full coffee cup with L hand      Unrestricted range of motion left elbow, forearm, fingers    Unrestricted motion of hips, knees and ankle of both legs   Wound Care:     Keep splint/dressing clean and dry. Do not remove dressing. We will take it off at your first follow up appointment   Diet: as you were eating previously.  Can use over the counter stool softeners and bowel preparations, such as Miralax, to help with bowel movements.  Narcotics can be constipating.  Be sure to drink plenty of fluids  PAIN MEDICATION USE AND EXPECTATIONS  You have likely been given narcotic medications to help control your pain.  After a traumatic event that results in an fracture (broken bone) with or without surgery, it is ok to use narcotic pain medications to help control one's pain.  We understand that everyone responds to pain differently and each individual patient will be evaluated on a regular basis for the continued need for narcotic medications. Ideally, narcotic medication use should last no more than 6-8 weeks (coinciding with fracture healing).   As a patient it is your responsibility as well to monitor narcotic medication use and report the amount and frequency you use these medications when you come to your office visit.   We would also advise that if you are using narcotic medications, you should take a dose prior to therapy to maximize you participation.  IF YOU ARE ON NARCOTIC MEDICATIONS IT IS NOT PERMISSIBLE TO OPERATE A MOTOR VEHICLE (MOTORCYCLE/CAR/TRUCK/MOPED) OR HEAVY MACHINERY DO NOT MIX NARCOTICS WITH OTHER CNS  (CENTRAL NERVOUS SYSTEM) DEPRESSANTS SUCH AS ALCOHOL   STOP SMOKING OR USING NICOTINE PRODUCTS!!!!  As discussed nicotine severely impairs your body's ability to heal surgical and traumatic wounds but also impairs bone healing.  Wounds and bone heal by forming microscopic blood vessels (angiogenesis) and nicotine is a vasoconstrictor (essentially, shrinks blood vessels).  Therefore, if vasoconstriction occurs to these microscopic blood vessels they essentially disappear and are unable to deliver necessary nutrients to the healing tissue.  This is one modifiable factor that you can do to dramatically increase your chances of healing your injury.    (This means no smoking, no nicotine gum, patches, etc)  DO NOT USE NONSTEROIDAL ANTI-INFLAMMATORY DRUGS (NSAID'S)  Using products such as Advil (ibuprofen), Aleve (naproxen), Motrin (ibuprofen) for additional pain control during fracture healing can delay and/or prevent the healing response.  If you would like to take over the counter (OTC) medication, Tylenol (acetaminophen) is ok.  However, some narcotic medications that are given for pain control contain acetaminophen as well. Therefore, you should not exceed more than 4000 mg of tylenol in a day if you do not have liver disease.  Also note that there are may OTC medicines, such as cold medicines and allergy medicines that my contain tylenol as well.  If you have any questions about medications and/or interactions please ask your doctor/PA or your pharmacist.      ICE AND ELEVATE INJURED/OPERATIVE EXTREMITY  Using ice  and elevating the injured extremity above your heart can help with swelling and pain control.  Icing in a pulsatile fashion, such as 20 minutes on and 20 minutes off, can be followed.    Do not place ice directly on skin. Make sure there is a barrier between to skin and the ice pack.    Using frozen items such as frozen peas works well as the conform nicely to the are that needs to be  iced.  USE AN ACE WRAP OR TED HOSE FOR SWELLING CONTROL  In addition to icing and elevation, Ace wraps or TED hose are used to help limit and resolve swelling.  It is recommended to use Ace wraps or TED hose until you are informed to stop.    When using Ace Wraps start the wrapping distally (farthest away from the body) and wrap proximally (closer to the body)   Example: If you had surgery on your leg or thing and you do not have a splint on, start the ace wrap at the toes and work your way up to the thigh        If you had surgery on your upper extremity and do not have a splint on, start the ace wrap at your fingers and work your way up to the upper arm  IF YOU ARE IN A SPLINT OR CAST DO NOT REMOVE IT FOR ANY REASON   If your splint gets wet for any reason please contact the office immediately. You may shower in your splint or cast as long as you keep it dry.  This can be done by wrapping in a cast cover or garbage back (or similar)  Do Not stick any thing down your splint or cast such as pencils, money, or hangers to try and scratch yourself with.  If you feel itchy take benadryl as prescribed on the bottle for itching  IF YOU ARE IN A CAM BOOT (BLACK BOOT)  You may remove boot periodically. Perform daily dressing changes as noted below.  Wash the liner of the boot regularly and wear a sock when wearing the boot. It is recommended that you sleep in the boot until told otherwise  CALL THE OFFICE WITH ANY QUESTIONS OR CONCERNS: [phone]        Discharge Pin Site Instructions  Dress pins daily with Kerlix roll starting on POD 2. Wrap the Kerlix so that it tamps the skin down around the pin-skin interface to prevent/limit motion of the skin relative to the pin.  (Pin-skin motion is the primary cause of pain and infection related to external fixator pin sites).  Remove any crust or coagulum that may obstruct drainage with a saline moistened gauze or soap and water.  After POD 3, if  there is no discernable drainage on the pin site dressing, the interval for change can by increased to every other day.  You may shower with the fixator, cleaning all pin sites gently with soap and water.  If you have a surgical wound this needs to be completely dry and without drainage before showering.  The extremity can be lifted by the fixator to facilitate wound care and transfers.  Notify the office/Doctor if you experience increasing drainage, redness, or pain from a pin site, or if you notice purulent (thick, snot-like) drainage.  Discharge Wound Care Instructions  Do NOT apply any ointments, solutions or lotions to pin sites or surgical wounds.  These prevent needed drainage and even though solutions like hydrogen peroxide kill  bacteria, they also damage cells lining the pin sites that help fight infection.  Applying lotions or ointments can keep the wounds moist and can cause them to breakdown and open up as well. This can increase the risk for infection. When in doubt call the office.  Surgical incisions should be dressed daily.  If any drainage is noted, use one layer of adaptic, then gauze, Kerlix, and an ace wrap.  Once the incision is completely dry and without drainage, it may be left open to air out.  Showering may begin 36-48 hours later.  Cleaning gently with soap and water.  Traumatic wounds should be dressed daily as well.    One layer of adaptic, gauze, Kerlix, then ace wrap.  The adaptic can be discontinued once the draining has ceased    If you have a wet to dry dressing: wet the gauze with saline the squeeze as much saline out so the gauze is moist (not soaking wet), place moistened gauze over wound, then place a dry gauze over the moist one, followed by Kerlix wrap, then ace wrap.

## 2018-07-07 NOTE — Progress Notes (Signed)
Orthopedic Trauma Service Progress Note  Patient ID: PAMLA PANGLE MRN: [id number] DOB/AGE: Jun 01, 1956 62 y.o.  Subjective:  Doing better  Plans on dc'ing home with daughter  Insurance denied CIR   Mobilizing ok  Still with quite a bit of L groin pain but not too severe    ROS As above  Objective:   VITALS:   Vitals:   07/06/18 1354 07/06/18 2110 07/07/18 0440 07/07/18 0947  BP: 134/83 117/74 122/75 139/76  Pulse: 67 68 65 74  Resp: 17 18 17 18   Temp: 98.4 F (36.9 C) 98.7 F (37.1 C) 98.5 F (36.9 C) 98.6 F (37 C)  TempSrc: Oral Oral Oral Oral  SpO2: 91% 97% 96% 96%  Weight:        Estimated body mass index is 33.59 kg/m as calculated from the following:   Height as of 08/17/13: 5\' 3"  (1.6 m).   Weight as of this encounter: 86 kg.   Intake/Output      11/07 0701 - 11/08 0700 11/08 0701 - 11/09 0700   P.O. 100 540   I.V. (mL/kg)     Total Intake(mL/kg) 100 (1.2) 540 (6.3)   Urine (mL/kg/hr) 500 (0.2)    Stool     Total Output 500    Net -400 +540        Urine Occurrence 3 x 2 x   Stool Occurrence 1 x 1 x     LABS  No results found for this or any previous visit (from the past 24 hour(s)).   PHYSICAL EXAM:   Gen: awake and alert, NAD, appears well, sitting on EOB Lungs: breathing unlabored Cardiac: regular  Pelvis:  No pain with axial loading or log rolling of hips B   Able to do SLR on R   Unable to do SLR on L due to pain    Ext:       Left Upper Extremity              Splint fitting well             Ext warm             Moderate swelling to L hand              Moves fingers without difficulty             Radial, ulnar, median nv motor and sensory functions intact             R/U/M/AIN/PIN motor intact             Good color distally              Elbow nontender    Assessment/Plan: 3 Days Post-Op   Principal Problem:   Fall Active Problems:  Displaced fracture of distal end of left radius   Multiple open lateral compression fractures of pelvis with stable pelvic ring (HCC)   Closed left scapular fracture   Anti-infectives (From admission, onward)   Start     Dose/Rate Route Frequency Ordered Stop   07/04/18 2000  ceFAZolin (ANCEF) IVPB 2g/100 mL premix     2 g 200 mL/hr over 30 Minutes Intravenous Every 8 hours 07/04/18 1610 07/05/18 1456   07/04/18 0600  ceFAZolin (ANCEF) IVPB 2g/100 mL premix  2 g 200 mL/hr over 30 Minutes Intravenous To Short Stay 07/03/18 1237 07/04/18 1156    .  POD/HD#: 59  62 y/o female s/p fall with multiple fractures   -fall   - comminuted L intra-articular distal radius fracture s/p ORIF             NWB L wrist             Ok to WB thru elbow             Finger motion, elbow motion as tolerated             Ice and elevate (fingers above elbow and elbow above heart)             PT/OT             Splint x 2 weeks then likely conversion to [facility]   - L LC 1 pelvic ring fracture             WBAT              ROM as tolerated             PT/OT             Platform walker for left side             Ice PRN    - L scapula fracture             Non-op              ROM as tolerated             Ice PRN    - Pain management:             Continue with current regimen    - ABL anemia/Hemodynamics  Stable    - Medical issues              Per TS   - DVT/PE prophylaxis:             Mobilize              scds             Does not need pharmacologics from ortho standpoint             Will order knee high teds   - ID:              periop abx completed   - Metabolic Bone Disease:             + vitamin d insufficiency    Replace    - Activity:             As above    - FEN/GI prophylaxis/Foley/Lines:             Diet as tolerated    -Ex-fix/Splint care:             Keep splint clean and dry    - Impediments to fracture healing:             Poor bone density               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

## 2018-07-07 NOTE — Progress Notes (Signed)
Physical Therapy Treatment Patient Details Name: Rachel Allison MRN: [id number] DOB: 1956/07/05 Today's Date: 07/07/2018    History of Present Illness 62 yo female with a history of HTN, GERD, cervical and lumbar disc fusion, fibromyalgia, who presented to the ED after a fall. Pt lives in a 19ft camper and was going to the bathroom this am when she fell down 3 steps. She did not hit the steps and landed on her left side. She states no LOC. Dizziness after the fall. Imaging revealed L wrist, L scapula, L rib and L pelvic fxs. Pt to have ORIF on L wrist 06/03/18    PT Comments    Pt progressing towards goals and was able to begin ambulation with use of L platform walker. Pt continues to be limited in safe mobility by groin pain and L UE pain. Pt was educated on use of platform walker and was able to progress to min guard. Pt also educated on need to ambulate with someone in the beginning to insure safety. Pt and son also educated on car transfers for d/c home this afternoon.     Follow Up Recommendations  Home health PT;Supervision/Assistance - 24 hour     Equipment Recommendations  Rolling walker with 5" wheels;3in1 (PT);Other (comment);Hospital bed(Left platform walker )       Precautions / Restrictions Precautions Precautions: Fall Precaution Comments: s/p fall down steps Required Braces or Orthoses: Sling;Other Brace/Splint(L wrist splinted, sling in room not on pt on entry) Restrictions Weight Bearing Restrictions: Yes LUE Weight Bearing: Non weight bearing(NWB to left wrist, WBAT with scapula) LLE Weight Bearing: Weight bearing as tolerated    Mobility  Bed Mobility               General bed mobility comments: sitting EoB on entry  Transfers Overall transfer level: Needs assistance Equipment used: Left platform walker Transfers: Sit to/from Stand Sit to Stand: Min guard         General transfer comment: min guard for safety, vc for coming up to standing and then  placing L UE in platform and adjusting weightbearing before beginning ambulation   Ambulation/Gait Ambulation/Gait assistance: Min assist;Min guard Gait Distance (Feet): 10 Feet Assistive device: Left platform walker Gait Pattern/deviations: Step-to pattern;Decreased step length - right;Decreased stance time - left;Decreased weight shift to left;Antalgic Gait velocity: slowed Gait velocity interpretation: <1.31 ft/sec, indicative of household ambulator General Gait Details: minA progressing to min guard for ambuation with platform RW, vc for sequencing and for increased BoS to maintain stability         Balance Overall balance assessment: Needs assistance Sitting-balance support: Single extremity supported;Feet supported Sitting balance-[name]: Fair     Standing balance support: Single extremity supported Standing balance-[name]: Fair                              Cognition Arousal/Alertness: Awake/alert Behavior During Therapy: WFL for tasks assessed/performed Overall Cognitive Status: Within Functional Limits for tasks assessed                                           General Comments General comments (skin integrity, edema, etc.): Pt's son present during session. Educated pt and son on A-P transfer technique for getting into and out of car initially to decrease pivoting on her hips  Pertinent Vitals/Pain Pain Assessment: Faces Faces Pain Scale: Hurts a little bit Pain Location: L pelvis/rib hurting more today than L wrist Pain Descriptors / Indicators: Sharp;Aching;Constant;Grimacing Pain Intervention(s): Limited activity within patient's tolerance;Monitored during session;Repositioned           PT Goals (current goals can now be found in the care plan section) Acute Rehab PT Goals Patient Stated Goal: be independent PT Goal Formulation: With patient/family Time For Goal Achievement: 07/18/18 Potential to Achieve Goals:  Good Progress towards PT goals: Progressing toward goals    Frequency    Min 5X/week      PT Plan Discharge plan needs to be updated;Equipment recommendations need to be updated    Co-evaluation              AM-PAC PT "6 Clicks" Daily Activity  Outcome Measure  Difficulty turning over in bed (including adjusting bedclothes, sheets and blankets)?: Unable Difficulty moving from lying on back to sitting on the side of the bed? : Unable Difficulty sitting down on and standing up from a chair with arms (e.g., wheelchair, bedside commode, etc,.)?: Unable Help needed moving to and from a bed to chair (including a wheelchair)?: A Little Help needed walking in hospital room?: A Little Help needed climbing 3-5 steps with a railing? : Total 6 Click Score: 10    End of Session Equipment Utilized During Treatment: Gait belt Activity Tolerance: Patient limited by pain Patient left: in chair;with call bell/phone within reach(ready for transport to OR ) Nurse Communication: Mobility status PT Visit Diagnosis: Unsteadiness on feet (R26.81);Other abnormalities of gait and mobility (R26.89);History of falling (Z91.81);Difficulty in walking, not elsewhere classified (R26.2);Pain Pain - Right/Left: Left Pain - part of body: Shoulder;Hand;Hip     Time: 1610-9604 PT Time Calculation (min) (ACUTE ONLY): 22 min  Charges:  $Gait Training: 8-22 mins                     Jacky Dross B. Beverely Risen PT, DPT Acute Rehabilitation Services Pager [phone] Office [phone]    Elon Alas Fleet 07/07/2018, 2:48 PM

## 2018-07-07 NOTE — Progress Notes (Signed)
Occupational Therapy Treatment Patient Details Name: Rachel Allison MRN: [id number] DOB: 09-02-1955 Today's Date: 07/07/2018    History of present illness 62 yo female with a history of HTN, GERD, cervical and lumbar disc fusion, fibromyalgia, who presented to the ED after a fall. Pt lives in a 68ft camper and was going to the bathroom this am when she fell down 3 steps. She did not hit the steps and landed on her left side. She states no LOC. Dizziness after the fall. Imaging revealed L wrist, L scapula, L rib and L pelvic fxs. Pt to have ORIF on L wrist 06/03/18   OT comments  Pt completed L UE exercises this session with good recall and demonstration. Pt educated on toilet tongs to help with hygiene. Pt without additional questions or concerns at this time.    Follow Up Recommendations  CIR;Supervision - Intermittent    Equipment Recommendations  3 in 1 bedside commode;Tub/shower bench    Recommendations for Other Services Rehab consult    Precautions / Restrictions Precautions Precautions: Fall Precaution Comments: s/p fall down steps Required Braces or Orthoses: Sling;Other Brace/Splint Restrictions Weight Bearing Restrictions: Yes LUE Weight Bearing: Non weight bearing(NWB to left wrist, WBAT with scapula) LLE Weight Bearing: Weight bearing as tolerated       Mobility Bed Mobility               General bed mobility comments: sitting EoB on entry  Transfers Overall transfer level: Needs assistance Equipment used: Left platform walker Transfers: Sit to/from Stand Sit to Stand: Min guard         General transfer comment: min guard for safety, vc for coming up to standing and then placing L UE in platform and adjusting weightbearing before beginning ambulation     Balance Overall balance assessment: Needs assistance Sitting-balance support: Single extremity supported;Feet supported Sitting balance-[name]: Fair     Standing balance support: Single  extremity supported Standing balance-[name]: Fair                             ADL either performed or assessed with clinical judgement   ADL                                         General ADL Comments: educated on toilet tongs and shown with demo. pt states "oh wow. i didnt know they even made such a thing"     Vision       Perception     Praxis      Cognition Arousal/Alertness: Awake/alert Behavior During Therapy: WFL for tasks assessed/performed Overall Cognitive Status: Within Functional Limits for tasks assessed                                          Exercises Other Exercises Other Exercises: educated on pip/ dip/mcp AROM , tendon gliding exercises and elevation for edema management   Shoulder Instructions       General Comments Pt's son present during session. Educated pt and son on A-P transfer technique for getting into and out of car initially to decrease pivoting on her hips    Pertinent Vitals/ Pain       Pain Assessment: No/denies pain Faces Pain Scale:  Hurts a little bit Pain Location: L pelvis/rib hurting more today than L wrist Pain Descriptors / Indicators: Sharp;Aching;Constant;Grimacing Pain Intervention(s): Limited activity within patient's tolerance;Monitored during session;Repositioned  Home Living                                          Prior Functioning/Environment              Frequency  Min 3X/week        Progress Toward Goals  OT Goals(current goals can now be found in the care plan section)  Progress towards OT goals: Progressing toward goals  Acute Rehab OT Goals Patient Stated Goal: be independent OT Goal Formulation: With patient Time For Goal Achievement: 07/18/18 Potential to Achieve Goals: Good ADL Goals Pt Will Perform Upper Body Bathing: with modified independence;sitting Pt Will Perform Lower Body Bathing: with modified independence;sit  to/from stand;with adaptive equipment Pt Will Perform Upper Body Dressing: with modified independence;sitting Pt Will Perform Lower Body Dressing: with modified independence;sit to/from stand;with adaptive equipment Pt Will Transfer to Toilet: with modified independence;bedside commode Pt Will Perform Toileting - Clothing Manipulation and hygiene: with modified independence;sit to/from stand;sitting/lateral leans  Plan Discharge plan remains appropriate    Co-evaluation                 AM-PAC PT "6 Clicks" Daily Activity     Outcome Measure   Help from another person eating meals?: None Help from another person taking care of personal grooming?: A Little Help from another person toileting, which includes using toliet, bedpan, or urinal?: A Little Help from another person bathing (including washing, rinsing, drying)?: A Little Help from another person to put on and taking off regular upper body clothing?: A Little Help from another person to put on and taking off regular lower body clothing?: A Lot 6 Click Score: 18    End of Session        Activity Tolerance Patient tolerated treatment well   Patient Left in bed;with call bell/phone within reach;with bed alarm set   Nurse Communication Mobility status        Time: 1000-1012 OT Time Calculation (min): 12 min  Charges: OT General Charges $OT Visit: 1 Visit OT Treatments $Therapeutic Exercise: 8-22 mins   Mateo Flow, OTR/L  Acute Rehabilitation Services Pager: [phone] Office: [phone] .    Boone Master B 07/07/2018, 3:54 PM

## 2018-07-15 NOTE — Op Note (Signed)
NAME: Rachel Allison, Rachel K. MEDICAL RECORD [id number] ACCOUNT [id number] DATE OF BIRTH:Dec 22, 1955 FACILITY: [facility] LOCATION: [id number] PHYSICIAN:Hokulani Rogel H. Aireanna Luellen, MD  OPERATIVE REPORT  DATE OF PROCEDURE:  07/04/2018  PREOPERATIVE DIAGNOSES: 1.  Polytrauma from a fall from a camper. 2.  Comminuted intra-articular (three fragments) left distal radius fracture. 3.  Left radial shaft fracture.  POSTOPERATIVE DIAGNOSES: 1.  Polytrauma from a fall from a camper. 2.  Comminuted intra-articular (three fragments) left distal radius fracture. 3.  Left radial shaft fracture.  PROCEDURES: 1. OPEN REDUCTION INTERNAL FIXATION OF INTRA-ARTICULAR DISTAL RADIUS 2. OPEN REDUCTION INTERNAL FIXATION OF RADIAL SHAFT  SURGEON:  Myrene GalasMichael Nicha Hemann, MD  ASSISTANT:  Efraim KaufmannMelissa, RNFA  COMPLICATIONS:  None.  TOURNIQUET:  None.  DISPOSITION:  To PACU.  CONDITION:  Stable.  BRIEF INDICATIONS FOR PROCEDURE:  The patient is a right-hand dominant white female who fell 3 extended steps from her camper and managed to sustain significant polytrauma including left-sided rib fracture, left scapula fracture, pelvic ring fracture, as  well as a comminuted intra-articular left distal radius fracture.  I discussed with her some of the risks and benefits of surgery including the possibility of infection, nerve injury, vessel injury, DVT, PE, the possibility of using a cancellous  autograft to support areas of the articular surface, arthritis, loss of motion, nonunion, malunion, multiple others.  She did wish to proceed.  BRIEF SUMMARY OF PROCEDURE:  The patient was taken to the operating room where general anesthesia was induced.  She did receive preoperative antibiotics.  The left upper extremity was prepped and draped in the usual sterile fashion.  A tourniquet was  placed about the arm but never inflated during the procedure.  We began by drawing out a volar Henry approach, sharply incising and going down to the FCR tendon,  retracting the tendon and incising the deep aspect of the sheath.  The pronator was released  near its radial edge, teased over.  Baby Hohmanns were placed carefully, preserving periosteal attachment to the fracture site.  The shaft fracture extended quite a bit distally.  An additional extension of the incision was made there to get adequately proximal.  Distally, we began with reduction  of the lunate fossa, elevating this segment back such that it would be equal to the primary scaphoid fossa segment.  There was also a radial styloid component with intraarticular extension there.  This was reduced and pinned provisionally.  Once the articular reduction was satisfactory, significant traction and volar angulation was restored, this was similarly pinned.  I then applied the long plate, selecting one that would not only support the articular surface but also span the radial shaft  fracture.  This was secured distally using pegs and additional traction applied, and then the shaft was secured with standard screws as well as a lock screw.  The patient tolerated the procedure very well.  There appeared to be restoration of articular  congruity, radial height, inclination and tilt.  Wound was irrigated thoroughly.  The pronator was repaired with 0 Vicryl and then a 2-0 Vicryl for the subcu and a 3-0 nylon for the skin.  A sterile gently compressive dressing was applied and then a  volar splint.  The patient was awakened from anesthesia and transported to the PACU in stable condition.  Wound was irrigated thoroughly prior to closure.  PROGNOSIS:  The patient will be in a platform walker given her extensive other injuries.  She will be allowed to weightbear as tolerated through the left elbow, but  this may be difficult because of her scapular and rib fractures, similarly with the  pelvic ring for her lower extremities.  Consequently, skilled nursing or rehab stay is anticipated.  LN/NUANCE  D:07/15/2018  T:07/15/2018 JOB:[id number]

## 2020-07-17 IMAGING — DX DG PELVIS 3+V JUDET
3 series · 3 of 3 positions shown · non-contrast
Comparison: 07/03/2018

CLINICAL DATA: Pelvic ring fracture.  Status post fall.

EXAM:
JUDET PELVIS - 3+ VIEW

[pelvis ap]
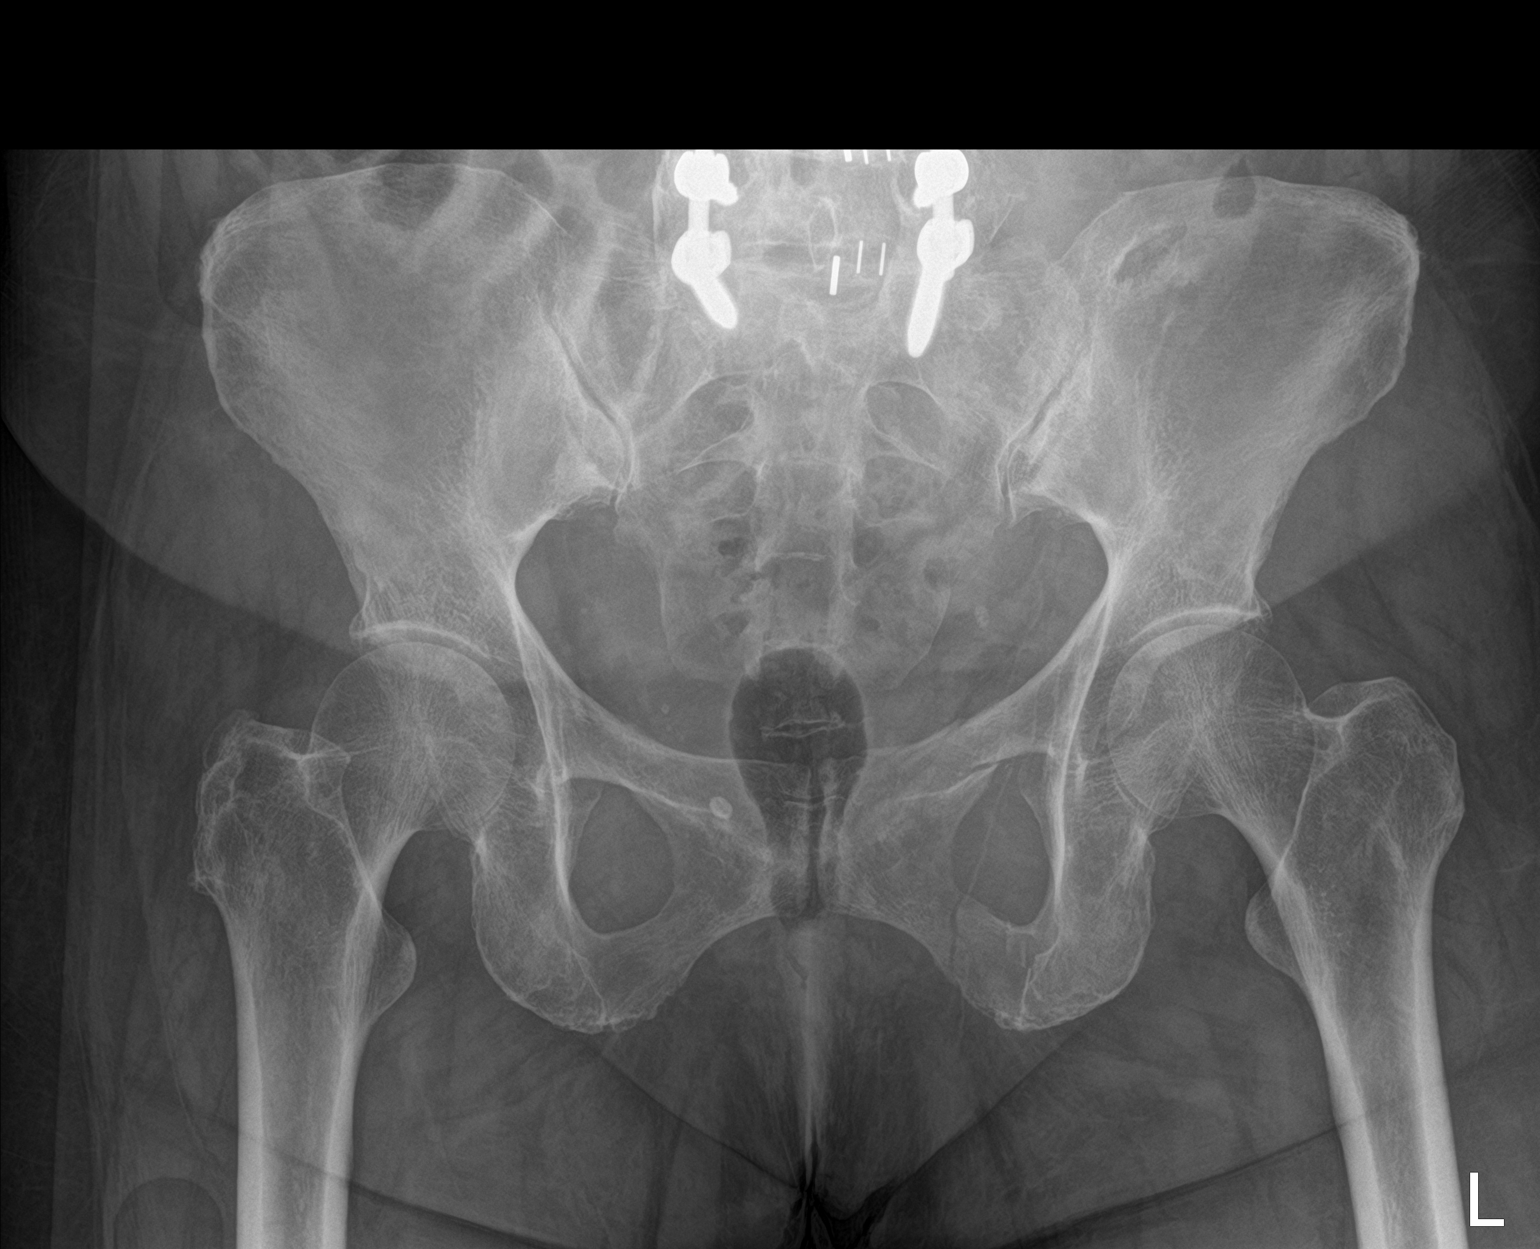

[pelvis obl (1 of 2)]
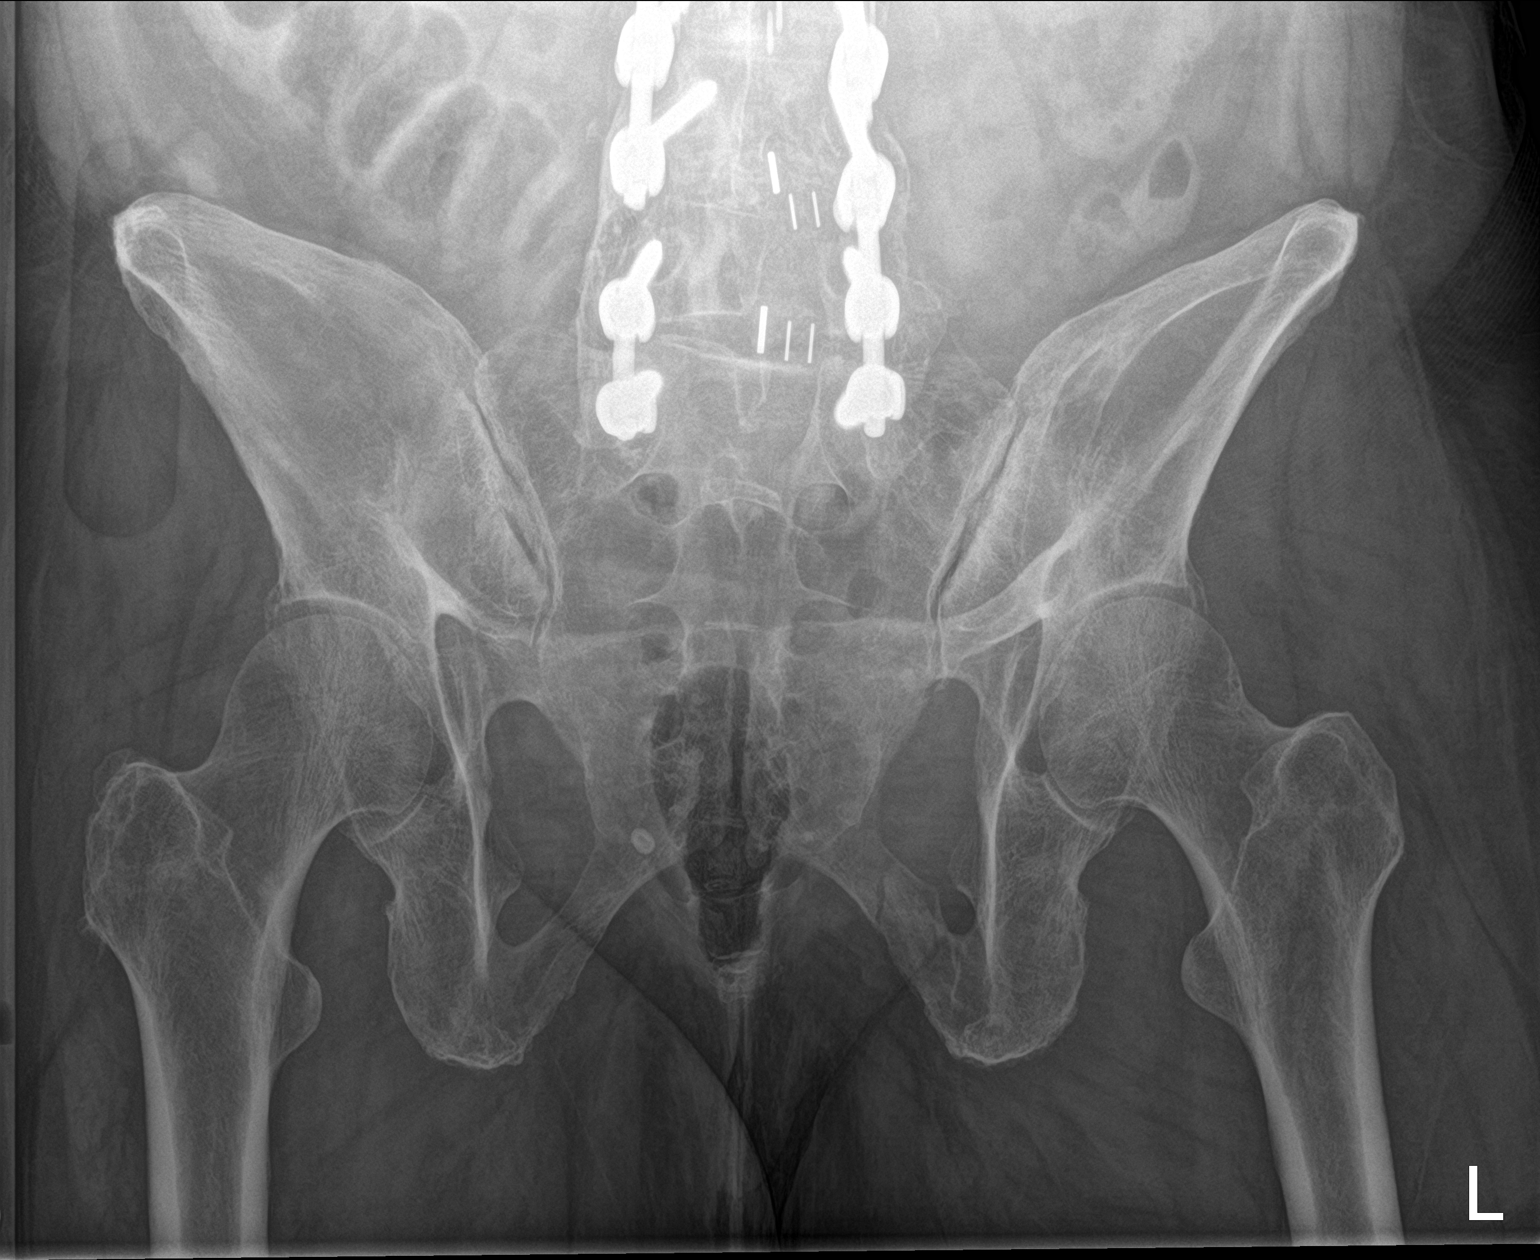

[pelvis obl (2 of 2)]
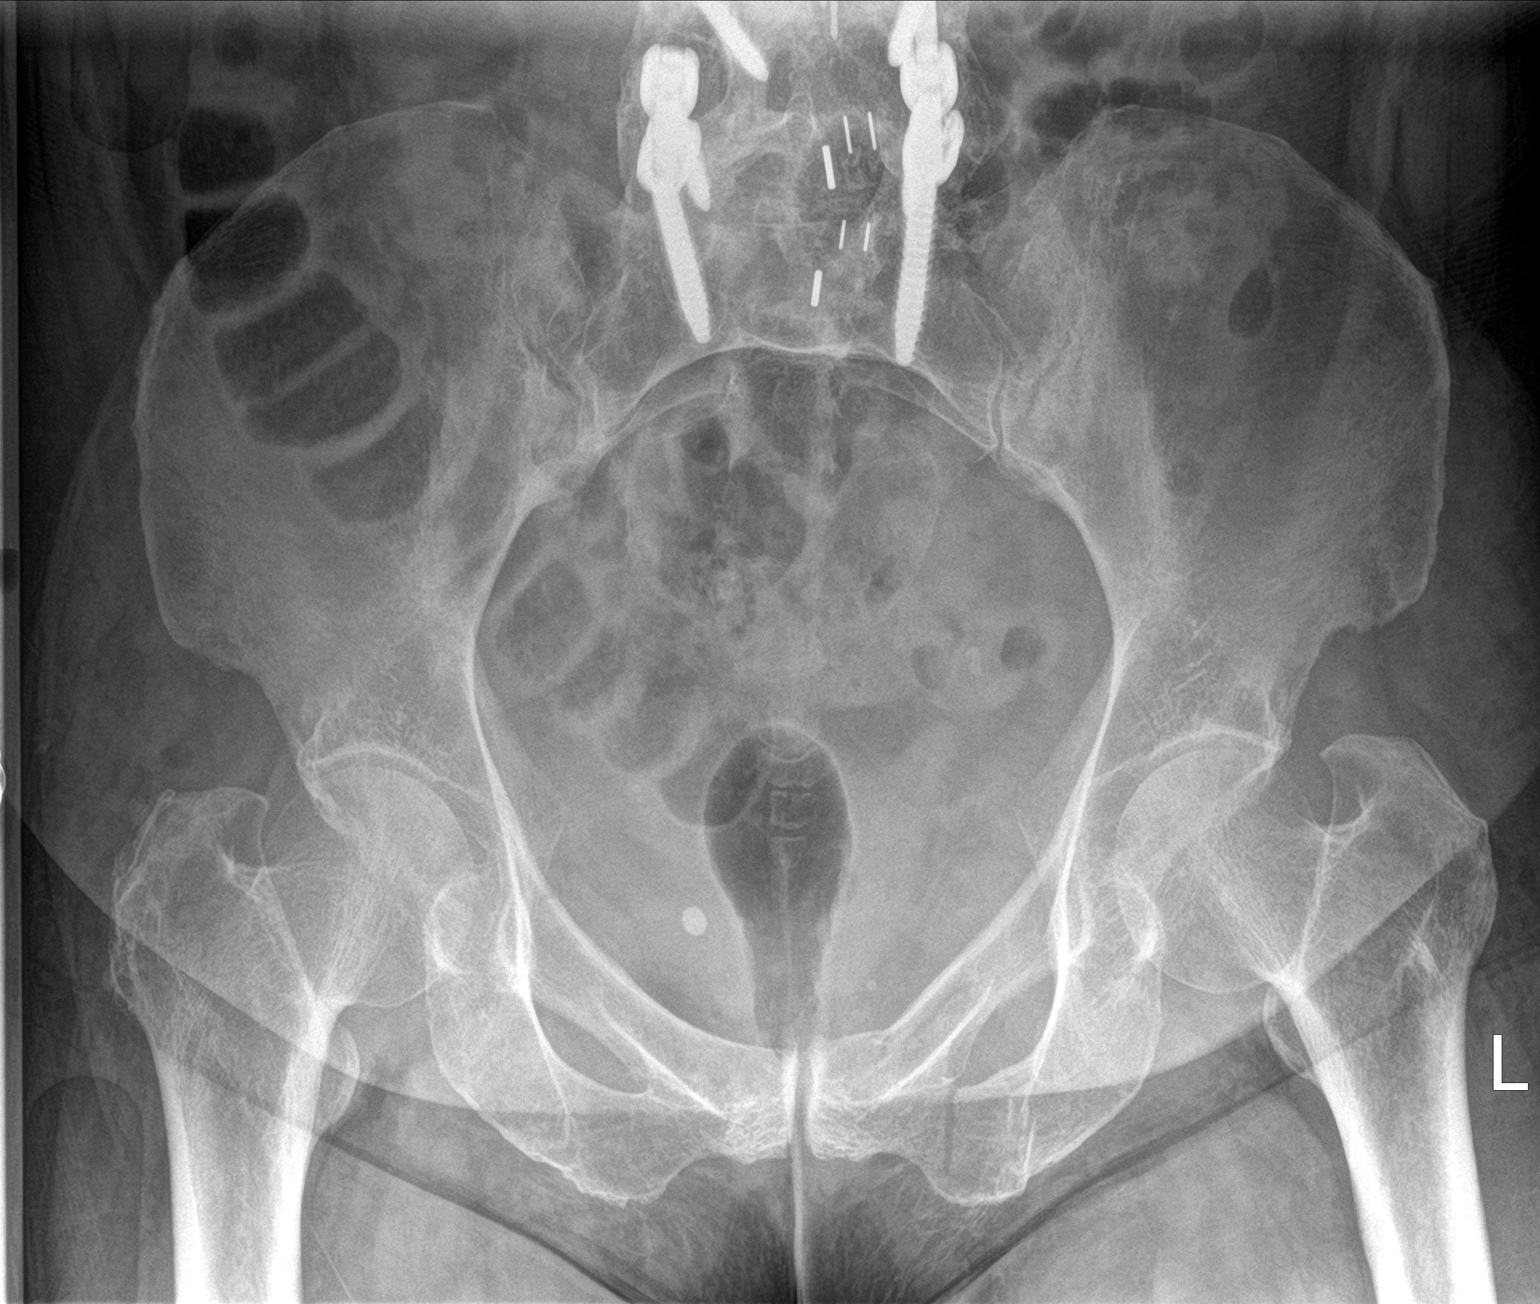

[3 of 3 positions shown; findings below may reference images not displayed]

FINDINGS: There are nondisplaced fractures of the left superior and inferior
pubic rami, unchanged from the prior study.

There are no other fractures.

Hip joints, SI joints and symphysis pubis are normally spaced and
aligned.

Stable changes from prior posterior lumbar spine fusion.

Skeletal structures are demineralized. Surrounding soft tissues are
unremarkable.
IMPRESSION: 1. No change in the appearance of the fractures of the left superior
inferior pubic rami. No other fractures. No significant fracture
displacement.
